# Patient Record
Sex: Female | Born: 1980
Health system: Southern US, Community
[De-identification: ages and names within clinical notes are randomized; demographics above are authoritative.]

## PROBLEM LIST (undated history)

## (undated) DIAGNOSIS — J45909 Unspecified asthma, uncomplicated: Secondary | ICD-10-CM

## (undated) DIAGNOSIS — F329 Major depressive disorder, single episode, unspecified: Secondary | ICD-10-CM

## (undated) DIAGNOSIS — F32A Depression, unspecified: Secondary | ICD-10-CM

## (undated) DIAGNOSIS — I10 Essential (primary) hypertension: Secondary | ICD-10-CM

---
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
## 2001-05-09 ENCOUNTER — Encounter (INDEPENDENT_AMBULATORY_CARE_PROVIDER_SITE_OTHER): Payer: Self-pay | Admitting: *Deleted

## 2001-05-09 ENCOUNTER — Encounter: Admission: RE | Admit: 2001-05-09 | Discharge: 2001-05-09 | Payer: Self-pay | Admitting: Family Medicine

## 2001-05-09 LAB — CONVERTED CEMR LAB

## 2001-05-23 ENCOUNTER — Encounter: Admission: RE | Admit: 2001-05-23 | Discharge: 2001-05-23 | Payer: Self-pay | Admitting: Family Medicine

## 2001-07-23 ENCOUNTER — Encounter: Admission: RE | Admit: 2001-07-23 | Discharge: 2001-07-23 | Payer: Self-pay | Admitting: Family Medicine

## 2001-09-01 ENCOUNTER — Encounter: Payer: Self-pay | Admitting: Emergency Medicine

## 2001-09-01 ENCOUNTER — Emergency Department (HOSPITAL_COMMUNITY): Admission: EM | Admit: 2001-09-01 | Discharge: 2001-09-01 | Payer: Self-pay | Admitting: Emergency Medicine

## 2001-09-02 ENCOUNTER — Encounter: Admission: RE | Admit: 2001-09-02 | Discharge: 2001-09-02 | Payer: Self-pay | Admitting: Family Medicine

## 2001-09-19 ENCOUNTER — Inpatient Hospital Stay (HOSPITAL_COMMUNITY): Admission: EM | Admit: 2001-09-19 | Discharge: 2001-09-22 | Payer: Self-pay | Admitting: Psychiatry

## 2001-09-19 ENCOUNTER — Encounter: Admission: RE | Admit: 2001-09-19 | Discharge: 2001-09-19 | Payer: Self-pay | Admitting: Family Medicine

## 2003-05-09 ENCOUNTER — Emergency Department (HOSPITAL_COMMUNITY): Admission: EM | Admit: 2003-05-09 | Discharge: 2003-05-10 | Payer: Self-pay | Admitting: Emergency Medicine

## 2003-09-27 ENCOUNTER — Emergency Department (HOSPITAL_COMMUNITY): Admission: EM | Admit: 2003-09-27 | Discharge: 2003-09-27 | Payer: Self-pay | Admitting: Emergency Medicine

## 2005-06-14 ENCOUNTER — Ambulatory Visit: Payer: Self-pay | Admitting: Family Medicine

## 2005-06-20 ENCOUNTER — Encounter: Admission: RE | Admit: 2005-06-20 | Discharge: 2005-06-20 | Payer: Self-pay | Admitting: Sports Medicine

## 2005-09-25 ENCOUNTER — Ambulatory Visit: Payer: Self-pay | Admitting: Sports Medicine

## 2005-12-26 ENCOUNTER — Ambulatory Visit: Payer: Self-pay | Admitting: Sports Medicine

## 2005-12-29 ENCOUNTER — Ambulatory Visit: Payer: Self-pay | Admitting: Family Medicine

## 2006-03-08 DIAGNOSIS — F329 Major depressive disorder, single episode, unspecified: Secondary | ICD-10-CM

## 2006-03-08 DIAGNOSIS — F3289 Other specified depressive episodes: Secondary | ICD-10-CM | POA: Insufficient documentation

## 2006-03-08 DIAGNOSIS — J309 Allergic rhinitis, unspecified: Secondary | ICD-10-CM | POA: Insufficient documentation

## 2006-03-08 DIAGNOSIS — E669 Obesity, unspecified: Secondary | ICD-10-CM | POA: Insufficient documentation

## 2006-03-08 DIAGNOSIS — J45909 Unspecified asthma, uncomplicated: Secondary | ICD-10-CM | POA: Insufficient documentation

## 2006-03-08 DIAGNOSIS — N912 Amenorrhea, unspecified: Secondary | ICD-10-CM

## 2006-03-08 DIAGNOSIS — F17200 Nicotine dependence, unspecified, uncomplicated: Secondary | ICD-10-CM

## 2006-03-09 ENCOUNTER — Encounter (INDEPENDENT_AMBULATORY_CARE_PROVIDER_SITE_OTHER): Payer: Self-pay | Admitting: *Deleted

## 2006-11-16 ENCOUNTER — Telehealth: Payer: Self-pay | Admitting: *Deleted

## 2006-11-17 ENCOUNTER — Emergency Department (HOSPITAL_COMMUNITY): Admission: EM | Admit: 2006-11-17 | Discharge: 2006-11-17 | Payer: Self-pay | Admitting: Emergency Medicine

## 2006-11-19 ENCOUNTER — Telehealth (INDEPENDENT_AMBULATORY_CARE_PROVIDER_SITE_OTHER): Payer: Self-pay | Admitting: *Deleted

## 2006-11-19 ENCOUNTER — Encounter (INDEPENDENT_AMBULATORY_CARE_PROVIDER_SITE_OTHER): Payer: Self-pay | Admitting: *Deleted

## 2006-11-19 ENCOUNTER — Ambulatory Visit: Payer: Self-pay | Admitting: Family Medicine

## 2006-11-19 DIAGNOSIS — A5901 Trichomonal vulvovaginitis: Secondary | ICD-10-CM

## 2006-11-20 ENCOUNTER — Encounter (INDEPENDENT_AMBULATORY_CARE_PROVIDER_SITE_OTHER): Payer: Self-pay | Admitting: Family Medicine

## 2007-09-20 ENCOUNTER — Emergency Department (HOSPITAL_COMMUNITY): Admission: EM | Admit: 2007-09-20 | Discharge: 2007-09-20 | Payer: Self-pay | Admitting: Family Medicine

## 2007-11-18 ENCOUNTER — Telehealth: Payer: Self-pay | Admitting: *Deleted

## 2007-11-21 ENCOUNTER — Ambulatory Visit: Payer: Self-pay | Admitting: Family Medicine

## 2007-11-21 LAB — CONVERTED CEMR LAB: Beta hcg, urine, semiquantitative: NEGATIVE

## 2008-04-17 ENCOUNTER — Encounter: Admission: RE | Admit: 2008-04-17 | Discharge: 2008-04-17 | Payer: Self-pay | Admitting: Family Medicine

## 2008-04-17 ENCOUNTER — Ambulatory Visit: Payer: Self-pay | Admitting: Family Medicine

## 2008-04-17 DIAGNOSIS — M25569 Pain in unspecified knee: Secondary | ICD-10-CM

## 2008-06-20 ENCOUNTER — Emergency Department (HOSPITAL_COMMUNITY): Admission: EM | Admit: 2008-06-20 | Discharge: 2008-06-20 | Payer: Self-pay | Admitting: Emergency Medicine

## 2008-06-22 ENCOUNTER — Telehealth: Payer: Self-pay | Admitting: Family Medicine

## 2008-06-23 ENCOUNTER — Ambulatory Visit: Payer: Self-pay | Admitting: Family Medicine

## 2008-06-23 DIAGNOSIS — I10 Essential (primary) hypertension: Secondary | ICD-10-CM

## 2008-06-26 ENCOUNTER — Telehealth: Payer: Self-pay | Admitting: *Deleted

## 2008-07-07 ENCOUNTER — Telehealth: Payer: Self-pay | Admitting: Family Medicine

## 2008-07-08 ENCOUNTER — Ambulatory Visit: Payer: Self-pay | Admitting: Family Medicine

## 2008-07-08 DIAGNOSIS — N63 Unspecified lump in unspecified breast: Secondary | ICD-10-CM

## 2008-07-09 ENCOUNTER — Telehealth: Payer: Self-pay | Admitting: Family Medicine

## 2008-07-20 ENCOUNTER — Telehealth: Payer: Self-pay | Admitting: Sports Medicine

## 2008-08-11 ENCOUNTER — Telehealth: Payer: Self-pay | Admitting: Family Medicine

## 2008-08-11 ENCOUNTER — Ambulatory Visit: Payer: Self-pay | Admitting: Family Medicine

## 2008-08-11 DIAGNOSIS — L723 Sebaceous cyst: Secondary | ICD-10-CM

## 2008-11-08 ENCOUNTER — Observation Stay (HOSPITAL_COMMUNITY): Admission: EM | Admit: 2008-11-08 | Discharge: 2008-11-08 | Payer: Self-pay | Admitting: Emergency Medicine

## 2009-01-12 ENCOUNTER — Telehealth: Payer: Self-pay | Admitting: Family Medicine

## 2010-01-17 ENCOUNTER — Encounter: Payer: Self-pay | Admitting: Family Medicine

## 2010-02-08 NOTE — Assessment & Plan Note (Signed)
 Summary: lump between breasts x 2 yr. bigger lately/[place]   Vital Signs:  Patient profile:   30 year old female Height:      63.5 inches Weight:      199 pounds BMI:     34.82 BSA:     1.94 Temp:     98.6 degrees F Pulse rate:   73 / minute BP sitting:   136 / 97  Vitals Entered By: Harlene Carte CMA (July 08, 2008 8:27 AM) CC: lump between breast x 2 years, recently bigger Pain Assessment Patient in pain? yes     Location: lump Intensity: 10 when touched   Primary Care Provider:  JEOFFREY ANGER MD  CC:  lump between breast x 2 years and recently bigger.  History of Present Illness: 89F with HTN, obesity, asthma comes in as work in with c/o painful breast mass.  Breast mass:  Has been present 3+ years, pt had U/S in 2007 that showed sebacious cyst.  Recently over the past 1-2 wks became more painful and enlarged.  Has had similar lesion years ago that popped and went away.  No fevers/chills, weight loss, no drainage, no other breast masses currently, no swollen glands or lesions noticed by pt.  Pt would like it removed.  Habits & Providers  Alcohol-Tobacco-Diet     Tobacco Status: current     Tobacco Counseling: to quit use of tobacco products     Cigarette Packs/Day: 0.5  Past History:  Past Medical History: Last updated: 03/08/2006 ASD at birth, now closed, Prolactin WNL; TSH 1.5  05/2001, Secondary amenorrhea, Suicide attempt 09/19/01 tranferred [facility]  Family History: Last updated: 06/23/2008 grandparents-diabetes, htn, CAD, deceased 60`s MI, uncle-CAD, deceased 52`s MI  Father: HTN, DM, deceased age 47 MI  Mom : h/o hirsutism, HTN  Social History: Last updated: 11/21/2007 She has an aunt who is her adopted mom and her step father.  She has brothers and sisters.  Network engineer for [employer]. Lives with fiance in their own apartment. Fiance has 3 kids. He is supportive.   Review of Systems       See HPI  Physical  Exam  General:  Well-developed,well-nourished,in no acute distress; alert,appropriate and cooperative throughout examination Chest Wall:  1.5 cm x 1cm firm, mobile, well circumscribed mass palpable just below skin level at medial border of left breast in inframammary fold.  No overlying erythema, mildly tender to palpation, no drainage. Breasts:  No mass, nodules, thickening, tenderness, bulging, retraction, inflamation, nipple discharge or skin changes noted.   Additional Exam:  Procedure: Excision of breast mass.  Pt informed and consented to procedure.  Explained risks, benefits, and alternatives.  Explained possibility of infection, scarring, incomplete excision.  Pt understands and agrees to procedure.    Time out conducted.  Field block performed with 1% lidocaine with epi  ~8cc.  Adequate local anesthesia achieved. Area prepped with betadyne, draped in a sterile fashion, 2.5cm incision made with #15 blade into lesion, darkish round lesions removed from area, scissors used to bluntly dissect around lesion.  Several of the round dark lesions removed as well as some surrounding fatty tissue.  Most of lesion removed.  4-0 Vicryl used and wound closed with running subcuticular suture.  Hemostasis achieved.  Covered with triple antibiotic ointment, gauze, and tegaderm.  Pt in stable condition.   Impression & Recommendations:  Problem # 1:  BREAST MASS (ICD-611.72) Assessment Deteriorated Sebacious cyst vs fat necrosis of breast tissue  vs other lesion.  Well circumscribed, doubt malignant.  Excisional biopsy performed today.  Specimen sent for pathologic diagnosis.  Pt to RTC in one week for me to check wound.  Orders: Provider Misc Charge- [facility] (Misc) [facility]- Est Level  3 [phone])  Complete Medication List: 1)  Diclofenac Sodium 50 Mg Tbec (Diclofenac sodium) .SABRA.. 1 tab q8 hours x 2 weeks, then as needed pain 2)  Hydrochlorothiazide  12.5 Mg Caps (Hydrochlorothiazide ) .SABRA.. 1 by mouth daily for  blood pressure 3)  Proventil  Hfa 108 (90 Base) Mcg/act Aers (Albuterol  sulfate) .... 2 puffs inhaled every 4 hours as needed for wheezing 4)  Prednisone 20 Mg Tabs (Prednisone) .... 2 by mouth daily x 10 days 5)  Percocet 5-325 Mg Tabs (Oxycodone-acetaminophen ) .... One tab by mouth q4h as needed for pain.  Patient Instructions: 1)  Great to meet you today, 2)  We excised the lesion in your breast today.  I want you to come back to see me in one week for a wound check, ok to double book appointments.   3)  Don't shower for 48h, after that it will be ok.  Keep it dry and clean. 4)  If you start to have fevers/chills, or the area starts to become reddish or drains whitish/yellowish pus then come back earlier to the office.  5)  We are sending the tissue samples off for analysis. 6)  I will also give you 15 percocet tabs. 7)  -Dr. ONEIDA. Prescriptions: PERCOCET 5-325 MG TABS (OXYCODONE-ACETAMINOPHEN ) One tab by mouth q4h as needed for pain.  #15 x 0   Entered and Authorized by:   Debby Petties MD   Signed by:   Debby Petties MD on 07/08/2008   Method used:   Print then Give to Patient   RxID:   [id number]

## 2010-02-08 NOTE — Progress Notes (Signed)
Summary: triage   Phone Note Call from Patient Call back at Home Phone [phone]   Caller: Patient Summary of Call: needs to talk to nurse about discharge Initial call taken by: De Nurse,  January 12, 2009 12:21 PM  Follow-up for Phone Call        discharge started today. it is greenish. denies itch or pain. wants to be seen next week as she is not in town now. appt made for 8:30am work in monday at her request Follow-up by: Golden Circle RN,  January 12, 2009 12:22 PM  Additional Follow-up for Phone Call Additional follow up Details #1::        I will not be her at that time but I appreciate you getting her an appointment.  Additional Follow-up by: Jamie Brookes MD,  January 12, 2009 3:18 PM

## 2010-02-08 NOTE — Progress Notes (Signed)
   Phone Note Outgoing Call Call back at [facility] Phone [phone]   Call placed by: Debby Petties MD,  July 20, 2008 5:01 PM Summary of Call: Called pt to see how she was doing.  She states that the left sided pain and lump in her throat she had in the previous phone note have completely resolved and she is feeling great.  Wound is clean, healing very well, no fluctuance, swelling, drainage, or pain.  Just has occasional mild itch over incision site.  She will call office if any problems arise.   Initial call taken by: Debby Petties MD,  July 20, 2008 5:01 PM

## 2010-02-10 NOTE — Miscellaneous (Signed)
Summary: asthma QI   Clinical Lists Changes  Problems: Changed problem from ASTHMA UNSPECIFIED WITH EXACERBATION (ICD-493.92) to ASTHMA, INTERMITTENT (ICD-493.90)

## 2010-04-14 LAB — POCT I-STAT, CHEM 8
BUN: 6 mg/dL (ref 6–23)
Calcium, Ion: 1.1 mmol/L — ABNORMAL LOW (ref 1.12–1.32)
Chloride: 104 mEq/L (ref 96–112)
Glucose, Bld: 129 mg/dL — ABNORMAL HIGH (ref 70–99)
HCT: 48 % — ABNORMAL HIGH (ref 36.0–46.0)
Hemoglobin: 16.3 g/dL — ABNORMAL HIGH (ref 12.0–15.0)
TCO2: 21 mmol/L (ref 0–100)

## 2010-05-27 NOTE — Discharge Summary (Signed)
NAME:  Katherine Knight, Katherine Knight                        ACCOUNT NO.:  [id number]   MEDICAL RECORD NO.:  [id number]                  PATIENT TYPE:  IPS   LOCATION:  0503                                 FACILITY:  [facility]   PHYSICIAN:  Geoffery Lyons, M.D.                   DATE OF BIRTH:  28-Nov-1980   DATE OF ADMISSION:  09/19/2001  DATE OF DISCHARGE:  09/22/2001                                 DISCHARGE SUMMARY   CHIEF COMPLAINT AND PRESENT ILLNESS:  This was the first admission to [facility] for this 30 year old single black female  voluntarily admitted.  She presented with a suicidal attempt gesture on  Wednesday, September 10, after having an argument with her father.  She was  feeling very frustrated, not being able to go back to school due to  financial reasons, unable to get a loan, had not been able to find a job  since June 2003.  She was unable to go back to school because of financial  reasons.  Her school performance was not up to par, grades were below  average, was unable to get financial aid.  She wrote a suicide note to her  parents and her pastor that she was giving up.  She had a plan to cut her  wrists, was found by her mother before she had attempted to harm herself.  She claimed she made a mistake, she was fine, she wanted to go home.   PAST PSYCHIATRIC HISTORY:  Noncontributory.   SUBSTANCE ABUSE HISTORY:  She denied the use or abuse of any substances.   PAST MEDICAL HISTORY:  Medical history: Some elevated blood pressure  readings, which she stated was secondary to anxiety.   MEDICATIONS:  She was not taking any active medications other than birth  control pills.   PHYSICAL EXAMINATION:  Physical examination was performed, failed to show  any acute findings.   MENTAL STATUS EXAM:  Mental status exam revealed an alert, pleasant,  cooperative, thin, neatly dressed female, good eye contact.  Speech was  clear and relevant, interacting and  conversational.  Mood: She claimed to  feel better.  Affect was pleasant.  Thought processes were logical,  coherent, goal directed.  Cognitive: Cognition was well preserved.   ADMISSION DIAGNOSES:   AXIS I:  Depressive disorder, not otherwise specified.   AXIS II:  No diagnosis.   AXIS III:  Elevated blood pressure readings.   AXIS IV:  Moderate.   AXIS V:  Global assessment of functioning upon admission 35, highest global  assessment of functioning in the last year 70-75.   LABORATORY DATA:  Other laboratory workup: CBC was within normal limits.  Blood chemistries were within normal limits.  Thyroid profile was within  normal limits.  Drug screen was negative for substances of abuse.   HOSPITAL COURSE:  She was admitted and started in intensive individual and  group psychotherapy.  She was able to identify the stressors, not having  job, not being able to go back to school, had been depressed, irritable,  less tolerant to frustration.  She started working on Pharmacologist, stress  management.  We had a family session with her parents.  She was going to  return home, find a job, and back to school next semester, and followup on  an outpatient basis.  On September 14, it was assessed that she was in full  contact with reality, mood improved, affect brighter.  She had tolerated the  Lexapro well with no side effects, willing and motivated to pursue further  treatment.  She would go home, would go to church, talk to her pastor, will  work with her family to find a job.  As there were no suicidal or homicidal  ideas, we discharged to outpatient treatment.   DISCHARGE DIAGNOSES:   AXIS I:  Depressive disorder, not otherwise specified.   AXIS II:  No diagnosis.   AXIS III:  No diagnosis.   AXIS IV:  Moderate   AXIS V:  Global assessment of functioning upon discharge 60.   DISCHARGE MEDICATIONS:  1. Lexapro 10 mg daily.  2. Ambien as needed for sleep.   FOLLOW UP:   [facility]                                                 Geoffery Lyons, M.D.    IL/MEDQ  D:  10/23/2001  T:  10/23/2001  Job:  [id number]

## 2010-05-27 NOTE — H&P (Signed)
NAME:  Katherine Knight, Katherine Knight                        ACCOUNT NO.:  [id number]   MEDICAL RECORD NO.:  [id number]                   PATIENT TYPE:  IPS   LOCATION:  0503                                 [facility]ILITY[facility]   PHYSICIAN:  Geoffery Lyons, MD                     DATE OF BIRTH:  1980-09-20   DATE OF ADMISSION:  09/19/2001  DATE OF DISCHARGE:                         PSYCHIATRIC ADMISSION ASSESSMENT   IDENTIFYING INFORMATION:  This is a 30 year old single black female who is  voluntarily admitted on September 19, 2001.   HISTORY OF PRESENT ILLNESS:  The patient presented with a suicide  attempt/gesture on Wednesday, September 18, 2001 after having an argument  with her father.  The patient was feeling very frustrated with not being  able to go back to school due to financial reasons, unable to get a loan,  has not been able to find a job since June of 2003.  The patient states she  was unable to go back to school at [employer] because of financial reasons.  Her  school performance was not up to par.  Grades are below average and was  unable to get financial aide.  The patient states she wrote a suicide note  to her parents and her pastor that she was giving up.  She was having a plan  to cut her wrist.  She states she was found by her mother before she had  attempted to harm herself.  Today, she states she feels fine, that she knew  she made a mistake and she states she would never harm herself again.  She  has good family support from her family, pastor and from her boyfriend.  Her  sleep and appetite has been good.   PAST PSYCHIATRIC HISTORY:  None.   SOCIAL HISTORY:  She is sexually active, has one partner for last six  months.  She practices safe sex.  Currently looking for employment.   FAMILY HISTORY:  The patient is adopted.  Currently, she states her adoptive  mother has been hospitalized twice for depression.   ALCOHOL/DRUG HISTORY:  Denies any alcohol or drug use.   PRIMARY CARE  PHYSICIAN:  Dr. Pricilla Holm, at [facility].   MEDICAL PROBLEMS:  None.  The patient has had some elevated blood pressure  readings, she states, from anxiety.   MEDICATIONS:  Birth control pills.  Currently does not have her pills with  her and states she needs to get a refill.   ALLERGIES:  No known allergies.   PHYSICAL EXAMINATION:  VITAL SIGNS:  The patient is 5 feet 3 inches tall.  She is 204 pounds.  Her vital signs are temperature 98.4, heart rate 75,  respirations 16, blood pressure 157/96.  GENERAL:  The patient is overweight, 30 year old single African-American  female in no acute distress.  She is obese in stature and appears her stated  age.  She is well-groomed, alert and cooperative, very pleasant.  HEENT:  Head is normocephalic.  She can raise her eyebrows.  Her hair is  chin-length and evenly distributed.  Her EOMs are intact.  Her external ear  canals are patent.  Cerumen is in both ear canals.  Hearing is appropriate  to conversation.  No sinus tenderness.  No nasal discharge.  Her mucosa is  moist.  She has good dentition.  Tongue protrudes midline without tremor.  NECK:  Supple.  No JVD.  Negative lymphadenopathy.  Trachea is midline.  Thyroid is nonpalpable, nontender.  CHEST:  Clear to auscultation.  Thorax is symmetrical with good expansion.  No cough.  HEART:  Regular rate and rhythm.  Carotid pulses are equal and adequate.  No  edema was noted.  BREAST:  Exam is deferred.  ABDOMEN:  Obese, soft, nontender abdomen.  No CVA tenderness.  MUSCULOSKELETAL:  No joint swelling or deformity.  Good range of motion.  Muscle strength and tone is equal bilaterally with no signs of injury.  SKIN:  Warm and dry.  Nail beds are pink with good capillary refill.  No  rashes or lacerations were noted.  NEUROLOGIC:  Oriented x 3.  Cranial nerves are grossly intact.  Deep tendon  reflexes are equal.  Good grip strength bilaterally.  No involuntary   movements.  Gait is normal.  Cerebellar function intact with finger to  finger and normal alternating movements.  Romberg is negative.   Health maintenance issues were addressed with regards to nutrition, exercise  and follow-up on blood pressure.   LABORATORY DATA:  CBC within normal limits.  Urine pregnancy test was  negative.  Urinalysis was within normal limits.   MENTAL STATUS EXAM:  She is an alert, pleasant, cooperative, clean, neatly  dressed female with good eye contact.  Speech is clear and relevant,  interactive and conversational.  Mood states she feels good and better.  Her affect is pleasant and tries to be bright.  Thought processes are  logical, coherent and goal directed.  Cognitively, she is oriented x 3.  Good memory.  Judgment is fair.  Insight is fair.   DIAGNOSES:   AXIS I:  Depressive disorder not otherwise specified.   AXIS II:  Deferred.   AXIS III:  Elevated blood pressure readings.   AXIS IV:  Moderate (psychosocial stressors, primary support group,  education, occupation, economic problems).   AXIS V:  Current 30-35; past year 27-75.   PLAN:  Voluntary admission to [facility] for depression and  suicidal gesture.  Contract for safety.  Initiate Lexapro to decrease  depressive symptoms and stabilize mood and thinking so patient can be safe.  Have family session prior to discharge.  The patient to attend groups to  improve her coping skills.  To follow up with mental health.  Health  maintenance was discussed of patient's nutrition, exercise and blood  pressure control.   TENTATIVE LENGTH OF STAY:  Three to four days.     Landry Corporal, N.P.                       Geoffery Lyons, MD    JO/MEDQ  D:  09/20/2001  T:  09/20/2001  Job:  [id number]

## 2010-10-18 LAB — DIFFERENTIAL
Eosinophils Absolute: 0
Eosinophils Relative: 1
Lymphs Abs: 0.7
Monocytes Relative: 4
Neutro Abs: 4
Neutrophils Relative %: 81 — ABNORMAL HIGH

## 2010-10-18 LAB — BASIC METABOLIC PANEL
CO2: 24
Creatinine, Ser: 0.88
GFR calc Af Amer: >60
Glucose, Bld: 80

## 2010-10-18 LAB — CBC
Hemoglobin: 14.3
MCV: 93.4
Platelets: 277

## 2011-02-09 ENCOUNTER — Other Ambulatory Visit: Payer: Self-pay | Admitting: Family Medicine

## 2011-06-23 ENCOUNTER — Encounter: Payer: Self-pay | Admitting: Family Medicine

## 2011-06-23 ENCOUNTER — Ambulatory Visit (INDEPENDENT_AMBULATORY_CARE_PROVIDER_SITE_OTHER): Payer: Self-pay | Admitting: Family Medicine

## 2011-06-23 VITALS — BP 152/102 | HR 79 | Temp 98.4°F | Ht 63.0 in | Wt 193.8 lb

## 2011-06-23 DIAGNOSIS — N912 Amenorrhea, unspecified: Secondary | ICD-10-CM

## 2011-06-23 DIAGNOSIS — Z202 Contact with and (suspected) exposure to infections with a predominantly sexual mode of transmission: Secondary | ICD-10-CM

## 2011-06-23 DIAGNOSIS — R21 Rash and other nonspecific skin eruption: Secondary | ICD-10-CM

## 2011-06-23 DIAGNOSIS — Z2089 Contact with and (suspected) exposure to other communicable diseases: Secondary | ICD-10-CM

## 2011-06-23 LAB — HIV ANTIBODY (ROUTINE TESTING W REFLEX): HIV: NONREACTIVE

## 2011-06-23 MED ORDER — TRIAMCINOLONE ACETONIDE 0.1 % EX CREA: TOPICAL_CREAM | Freq: Two times a day (BID) | CUTANEOUS | Status: AC

## 2011-06-23 NOTE — Progress Notes (Signed)
Patient ID: Katherine Knight, female   DOB: Nov 27, 1980, 31 y.o.   MRN: [id number] Katherine Knight is a 31 y.o. female who presents to [facility] today for rash:  1.  Rash:  Erupted about 1 month ago.  Had several of red, scaly patches that start on arms and legs and then spread to trunk.  Describes itching and burning with them.  No previous illnesses prior to these episodes.    History of this around spring-time last year, told it was pityriasis rosea at that time.  Lasted until December.  Had some spots palm of her hand last time, none now.  Not given any medications.     The following portions of the patient's history were reviewed and updated as appropriate: allergies, current medications, past medical history, family and social history, and problem list.  Patient is a nonsmoker.   ROS as above otherwise neg. No Chest pain, palpitations, SOB, Fever, Chills, Abd pain, N/V/D.  Medications reviewed. Current Outpatient Prescriptions  Medication Sig Dispense Refill  . albuterol (PROVENTIL HFA) 108 (90 BASE) MCG/ACT inhaler Inhale 2 puffs into the lungs every 4 (four) hours as needed.        . diclofenac (VOLTAREN) 50 MG EC tablet Take 50 mg by mouth every 8 (eight) hours. Take for 2 weeks, then as needed for pain       . hydrochlorothiazide (HYDRODIURIL) 12.5 MG tablet Take 12.5 mg by mouth daily.        . predniSONE (DELTASONE) 20 MG tablet Take 40 mg by mouth daily. Take 2 by mouth daily for 10 days       . sulfamethoxazole-trimethoprim (BACTRIM DS,SEPTRA DS) 800-160 MG per tablet Take 1 tablet by mouth 2 (two) times daily.          Exam:  BP 152/102  Pulse 79  Temp 98.4 F (36.9 C) (Oral)  Ht 5\' 3"  (1.6 m)  Wt 193 lb 12.8 oz (87.907 kg)  BMI 34.33 kg/m2  LMP 06/12/2011 Gen: Well NAD HEENT: EOMI,  MMM Lungs: CTABL Nl WOB Heart: RRR no MRG Abd: NABS, NT, ND Exts: Non edematous BL  LE, warm and well perfused.   No results found for this or any previous visit (from the past 72  hour(s)).

## 2011-06-23 NOTE — Patient Instructions (Addendum)
I'm not sure at this time what the spots are.  I am sending in a steroid cream for you to use for the next 2 weeks.   At that time, if they have not resolved, please come back and we may need to do a biopsy at that time.  The only other thing for today will be a blood test.  I will let you know the results.

## 2011-06-26 DIAGNOSIS — R21 Rash and other nonspecific skin eruption: Secondary | ICD-10-CM | POA: Insufficient documentation

## 2011-06-26 NOTE — Assessment & Plan Note (Signed)
Pityriasis less likely based on history and exam. Also examined with Dr. Mauricio Po.   Unclear etiology History of palmar rash (scarring now, no rash on palms or soles currently) -- therefore obtain RPR today. Triamcinolone for relief.   FU in 2 weeks if no resolution.   May need punch biopsy at that time.

## 2011-10-17 IMAGING — CR DG CHEST 2V
2 series · 2 of 2 positions shown · non-contrast
Comparison: 06/20/2008

CLINICAL DATA: Short of breath

CHEST - 2 VIEW

[w chest pa]
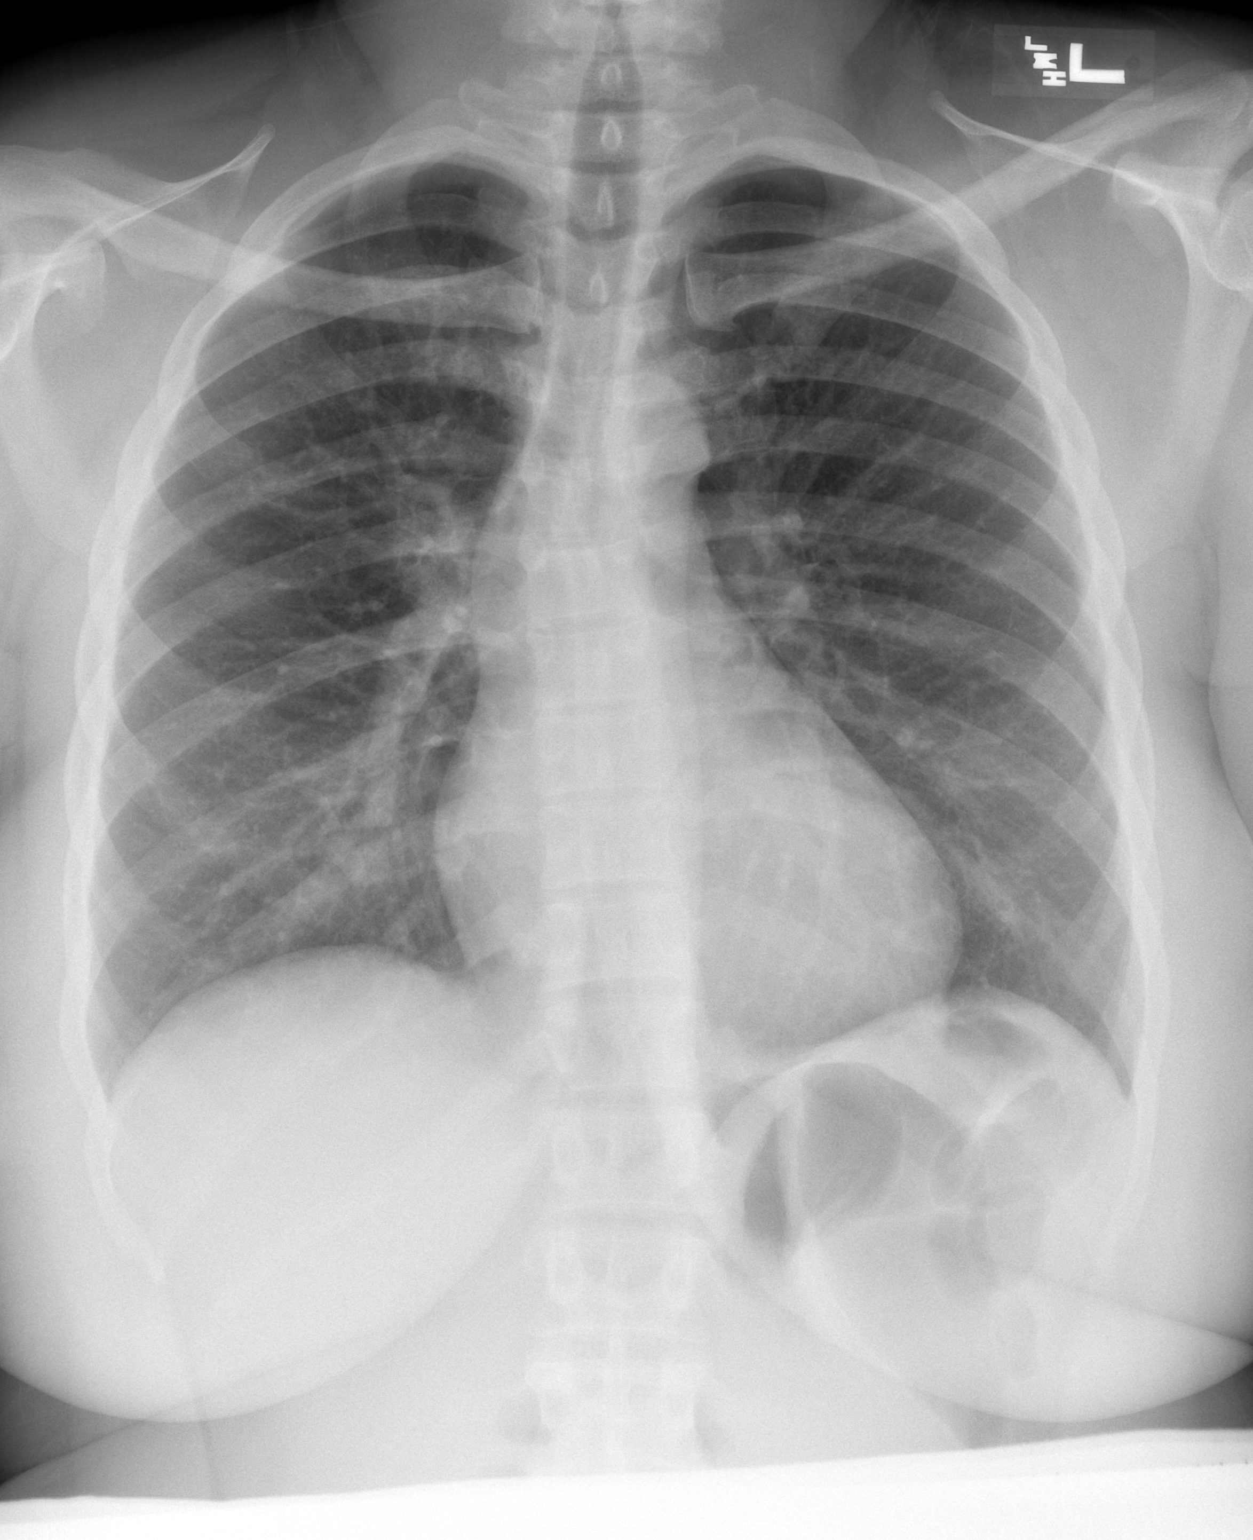

[w chest lat]
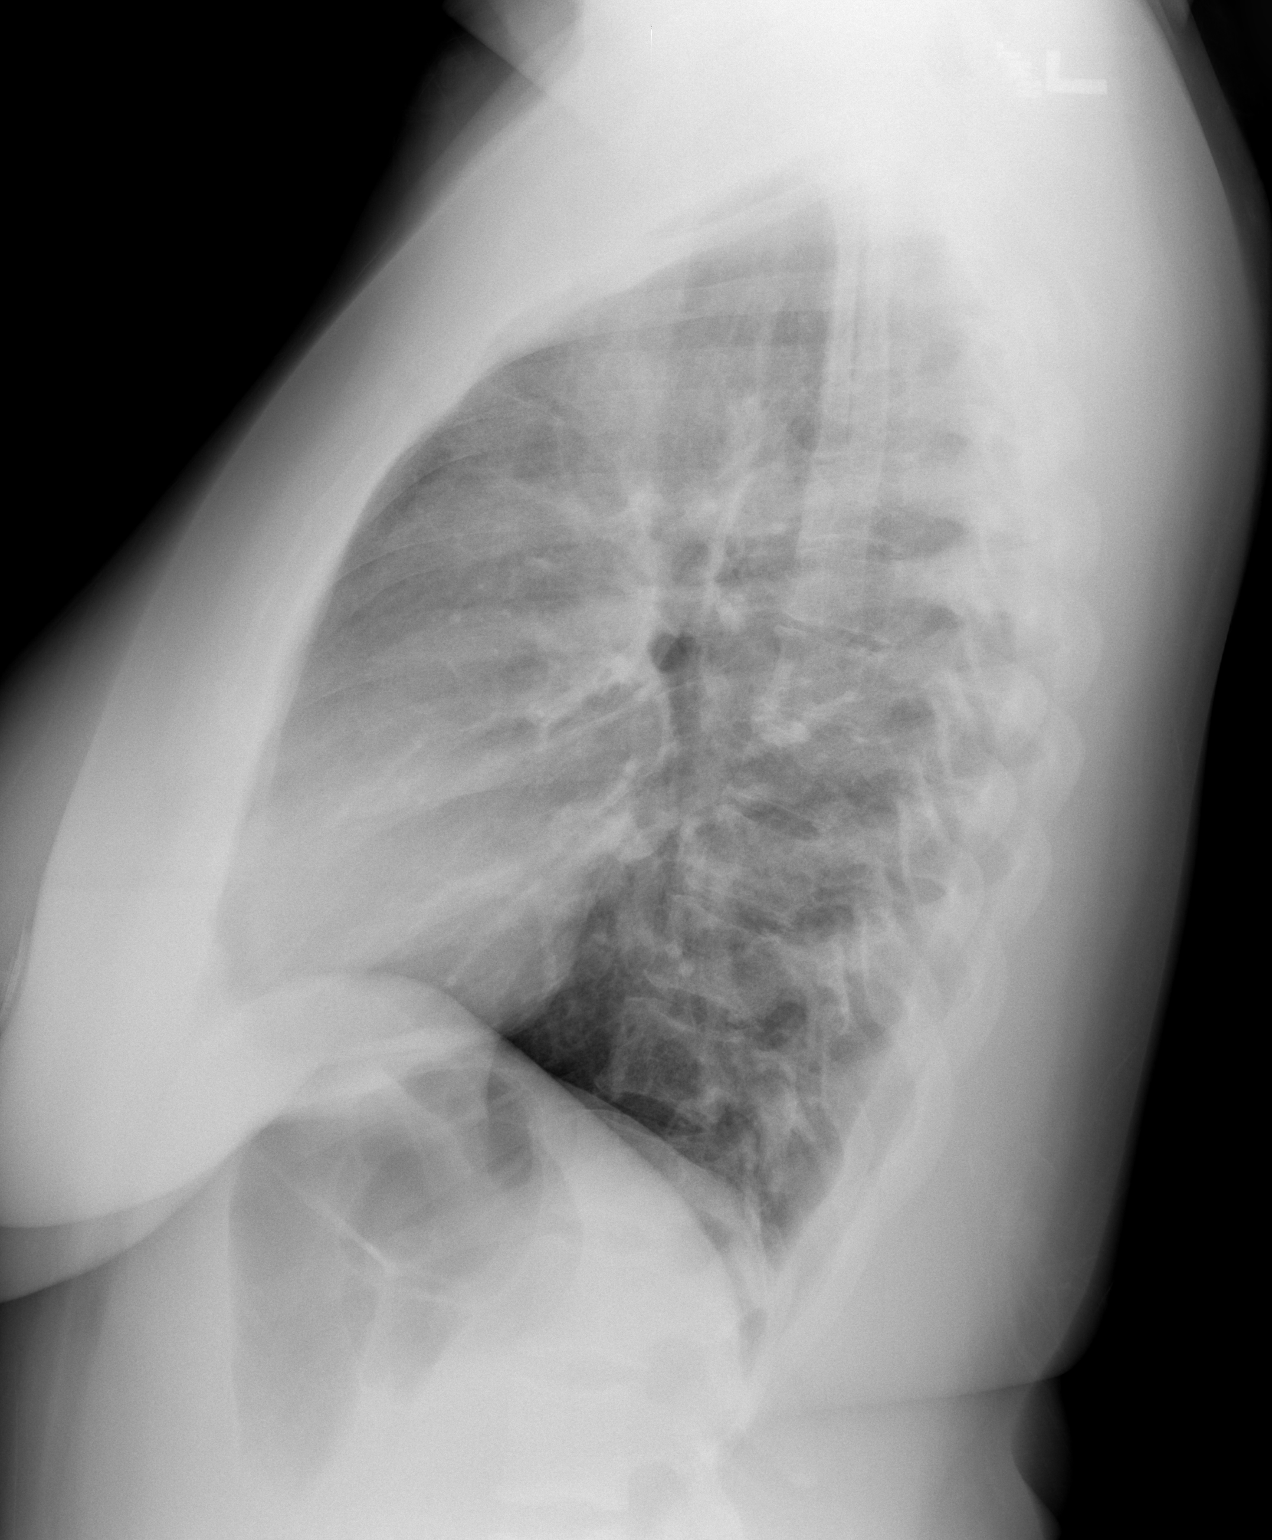

[2 of 2 positions shown; findings below may reference images not displayed]

FINDINGS: Normal heart size.  Clear lungs.  No pneumothorax.  No
pleural effusion.
IMPRESSION: No active cardiopulmonary disease.

## 2012-09-07 ENCOUNTER — Emergency Department (HOSPITAL_COMMUNITY)
Admission: EM | Admit: 2012-09-07 | Discharge: 2012-09-07 | Disposition: A | Discharge status: Home / Self Care | Payer: Self-pay | Attending: Emergency Medicine | Admitting: Emergency Medicine

## 2012-09-07 ENCOUNTER — Encounter (HOSPITAL_COMMUNITY): Payer: Self-pay | Admitting: *Deleted

## 2012-09-07 DIAGNOSIS — J45909 Unspecified asthma, uncomplicated: Secondary | ICD-10-CM | POA: Insufficient documentation

## 2012-09-07 DIAGNOSIS — R112 Nausea with vomiting, unspecified: Secondary | ICD-10-CM

## 2012-09-07 DIAGNOSIS — R197 Diarrhea, unspecified: Secondary | ICD-10-CM | POA: Insufficient documentation

## 2012-09-07 DIAGNOSIS — R5381 Other malaise: Secondary | ICD-10-CM | POA: Insufficient documentation

## 2012-09-07 DIAGNOSIS — F17200 Nicotine dependence, unspecified, uncomplicated: Secondary | ICD-10-CM | POA: Insufficient documentation

## 2012-09-07 DIAGNOSIS — Z79899 Other long term (current) drug therapy: Secondary | ICD-10-CM | POA: Insufficient documentation

## 2012-09-07 HISTORY — DX: Unspecified asthma, uncomplicated: J45.909

## 2012-09-07 MED ORDER — FAMOTIDINE IN NACL 20-0.9 MG/50ML-% IV SOLN
20.0000 mg | Freq: Once | INTRAVENOUS | Status: AC
  Administered 2012-09-07: 20 mg via INTRAVENOUS
  Filled 2012-09-07: qty 50

## 2012-09-07 MED ORDER — ONDANSETRON HCL 4 MG/2ML IJ SOLN
4.0000 mg | Freq: Once | INTRAMUSCULAR | Status: AC
  Administered 2012-09-07: 4 mg via INTRAVENOUS
  Filled 2012-09-07: qty 2

## 2012-09-07 MED ORDER — SODIUM CHLORIDE 0.9 % IV BOLUS (SEPSIS)
1000.0000 mL | Freq: Once | INTRAVENOUS | Status: AC
  Administered 2012-09-07: 1000 mL via INTRAVENOUS

## 2012-09-07 MED ORDER — PROMETHAZINE HCL 25 MG PO TABS: 25.0000 mg | ORAL_TABLET | Freq: Four times a day (QID) | ORAL | Status: DC | PRN

## 2012-09-07 NOTE — ED Notes (Signed)
Pt able to tolerate PO fluids.  

## 2012-09-07 NOTE — ED Provider Notes (Signed)
CSN: [id number]     Arrival date & time 09/07/12  1223 History   First MD Initiated Contact with Patient 09/07/12 1433     Chief Complaint  Patient presents with  . Emesis  . Diarrhea   (Consider location/radiation/quality/duration/timing/severity/associated sxs/prior Treatment) Patient is a 32 y.o. female presenting with vomiting and diarrhea. The history is provided by the patient.  Emesis Severity:  Moderate Duration:  12 hours Timing:  Constant Number of daily episodes:  6 Progression:  Unchanged Chronicity:  New Context: not post-tussive and not self-induced   Relieved by:  Nothing Worsened by:  Nothing tried Associated symptoms: diarrhea   Associated symptoms: no abdominal pain, no chills, no cough and no fever   Diarrhea:    Quality:  Watery   Number of occurrences:  3   Severity:  Moderate   Duration:  12 hours   Timing:  Constant   Progression:  Unchanged Risk factors: suspect food intake   Risk factors: no alcohol use, no diabetes, not pregnant now, no prior abdominal surgery, no sick contacts and no travel to endemic areas   Diarrhea Associated symptoms: vomiting   Associated symptoms: no abdominal pain, no chills, no recent cough and no fever     Past Medical History  Diagnosis Date  . Asthma    History reviewed. No pertinent past surgical history. History reviewed. No pertinent family history. History  Substance Use Topics  . Smoking status: Current Every Day Smoker -- 1.00 packs/day    Types: Cigarettes  . Smokeless tobacco: Not on file  . Alcohol Use: No   OB History   Grav Para Term Preterm Abortions TAB SAB Ect Mult Living                 Review of Systems  Constitutional: Positive for fatigue. Negative for fever and chills.  Respiratory: Negative for shortness of breath.   Gastrointestinal: Positive for nausea, vomiting and diarrhea. Negative for abdominal pain.  Genitourinary: Negative for frequency and flank pain.  Musculoskeletal:  Negative for back pain.  Neurological: Positive for weakness. Negative for dizziness and light-headedness.    Allergies  Review of patient's allergies indicates no known allergies.  Home Medications   Current Outpatient Rx  Name  Route  Sig  Dispense  Refill  . albuterol (PROVENTIL HFA) 108 (90 BASE) MCG/ACT inhaler   Inhalation   Inhale 2 puffs into the lungs every 4 (four) hours as needed.           . promethazine (PHENERGAN) 25 MG tablet   Oral   Take 1 tablet (25 mg total) by mouth every 6 (six) hours as needed for nausea.   20 tablet   0    BP 148/99  Pulse 81  Temp(Src) 98.5 F (36.9 C) (Oral)  Resp 18  Ht 5\' 3"  (1.6 m)  SpO2 98%  LMP 08/07/2012 Physical Exam  Nursing note and vitals reviewed. Constitutional: She appears well-developed and well-nourished. No distress.  HENT:  Head: Normocephalic and atraumatic.  Eyes: EOM are normal. No scleral icterus.  Neck: Normal range of motion. Neck supple.  Cardiovascular: Normal rate, regular rhythm and intact distal pulses.   Pulmonary/Chest: Effort normal and breath sounds normal. She has no wheezes. She has no rales.  Abdominal: Soft. She exhibits no distension. There is no tenderness. There is no rebound and no guarding.  Neurological: She is alert. She exhibits normal muscle tone. Coordination normal.  Skin: Skin is warm and dry. She is not  diaphoretic.  Psychiatric: She has a normal mood and affect.    ED Course  Procedures (including critical care time) Labs Review Labs Reviewed - No data to display Imaging Review No results found.  RA sat is 98% and I interpret to be normal  3:54 PM Pt is feeling improved, abd soft, drinking water without emesis.  No pain.  Pt did miss work today due to symptoms.    MDM   1. Nausea vomiting and diarrhea      Pt with N/V/D sudden onset about 6 hours after eating Chinese take out.  No obv sick contacts, no alcohol use, no recent abx use, denies fevers, chills.  No  abd tenderness.  Will give IVF's, IV antiemetics, monitor and attempt po challenge afterwards.  Not toxic appearing.      Gavin Pound. Marquerite Forsman, MD 09/07/12 1557

## 2012-09-07 NOTE — ED Notes (Signed)
Pt reports she woke up around 0300 this morning with n/v/d. Reports vomiting 6 times, and 3 episodes of diarrhea. Has not taken medicine to relieve symptoms

## 2013-02-08 ENCOUNTER — Encounter (HOSPITAL_COMMUNITY): Payer: Self-pay | Admitting: Emergency Medicine

## 2013-02-08 ENCOUNTER — Emergency Department (HOSPITAL_COMMUNITY)
Admission: EM | Admit: 2013-02-08 | Discharge: 2013-02-08 | Disposition: A | Discharge status: Home / Self Care | Payer: Self-pay | Attending: Emergency Medicine | Admitting: Emergency Medicine

## 2013-02-08 DIAGNOSIS — F411 Generalized anxiety disorder: Secondary | ICD-10-CM | POA: Insufficient documentation

## 2013-02-08 DIAGNOSIS — F418 Other specified anxiety disorders: Secondary | ICD-10-CM

## 2013-02-08 DIAGNOSIS — F17200 Nicotine dependence, unspecified, uncomplicated: Secondary | ICD-10-CM | POA: Insufficient documentation

## 2013-02-08 DIAGNOSIS — J45909 Unspecified asthma, uncomplicated: Secondary | ICD-10-CM | POA: Insufficient documentation

## 2013-02-08 HISTORY — DX: Depression, unspecified: F32.A

## 2013-02-08 HISTORY — DX: Major depressive disorder, single episode, unspecified: F32.9

## 2013-02-08 MED ORDER — HYDROXYZINE HCL 25 MG PO TABS: 25.0000 mg | ORAL_TABLET | Freq: Four times a day (QID) | ORAL | Status: DC
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
## 2013-02-08 NOTE — ED Notes (Signed)
Pt states she has had several panic attacks today and came to the ER for evaluation. States that she is very unhappy with her job. States that is presently looking for another job, but is still quite anxious at this time.

## 2013-02-08 NOTE — ED Notes (Signed)
Pt. reports panic attack this morning , pt. stated mild anxiety at triage , denies suicidal ideation , calm and cooperative .

## 2013-02-08 NOTE — Discharge Instructions (Signed)
Panic Attacks  Panic attacks are sudden, short-lived surges of severe anxiety, fear, or discomfort. They may occur for no reason when you are relaxed, when you are anxious, or when you are sleeping. Panic attacks may occur for a number of reasons:   · Healthy people occasionally have panic attacks in extreme, life-threatening situations, such as war or natural disasters. Normal anxiety is a protective mechanism of the body that helps us react to danger (fight or flight response).  · Panic attacks are often seen with anxiety disorders, such as panic disorder, social anxiety disorder, generalized anxiety disorder, and phobias. Anxiety disorders cause excessive or uncontrollable anxiety. They may interfere with your relationships or other life activities.  · Panic attacks are sometimes seen with other mental illnesses such as depression and posttraumatic stress disorder.  · Certain medical conditions, prescription medicines, and drugs of abuse can cause panic attacks.  SYMPTOMS   Panic attacks start suddenly, peak within 20 minutes, and are accompanied by four or more of the following symptoms:  · Pounding heart or fast heart rate (palpitations).  · Sweating.  · Trembling or shaking.  · Shortness of breath or feeling smothered.  · Feeling choked.  · Chest pain or discomfort.  · Nausea or strange feeling in your stomach.  · Dizziness, lightheadedness, or feeling like you will faint.  · Chills or hot flushes.  · Numbness or tingling in your lips or hands and feet.  · Feeling that things are not real or feeling that you are not yourself.  · Fear of losing control or going crazy.  · Fear of dying.  Some of these symptoms can mimic serious medical conditions. For example, you may think you are having a heart attack. Although panic attacks can be very scary, they are not life threatening.  DIAGNOSIS   Panic attacks are diagnosed through an assessment by your health care provider. Your health care provider will ask questions  about your symptoms, such as where and when they occurred. Your health care provider will also ask about your medical history and use of alcohol and drugs, including prescription medicines. Your health care provider may order blood tests or other studies to rule out a serious medical condition. Your health care provider may refer you to a mental health professional for further evaluation.  TREATMENT   · Most healthy people who have one or two panic attacks in an extreme, life-threatening situation will not require treatment.  · The treatment for panic attacks associated with anxiety disorders or other mental illness typically involves counseling with a mental health professional, medicine, or a combination of both. Your health care provider will help determine what treatment is best for you.  · Panic attacks due to physical illness usually goes away with treatment of the illness. If prescription medicine is causing panic attacks, talk with your health care provider about stopping the medicine, decreasing the dose, or substituting another medicine.  · Panic attacks due to alcohol or drug abuse goes away with abstinence. Some adults need professional help in order to stop drinking or using drugs.  HOME CARE INSTRUCTIONS   · Take all your medicines as prescribed.    · Check with your health care provider before starting new prescription or over-the-counter medicines.  · Keep all follow up appointments with your health care provider.  SEEK MEDICAL CARE IF:  · You are not able to take your medicines as prescribed.  · Your symptoms do not improve or get worse.  SEEK IMMEDIATE   MEDICAL CARE IF:   · You experience panic attack symptoms that are different than your usual symptoms.  · You have serious thoughts about hurting yourself or others.  · You are taking medicine for panic attacks and have a serious side effect.  MAKE SURE YOU:  · Understand these instructions.  · Will watch your condition.  · Will get help right away  if you are not doing well or get worse.  Document Released: 12/26/2004 Document Revised: 10/16/2012 Document Reviewed: 08/09/2012  [employer]® Patient Information ©2014 [employer].

## 2013-02-08 NOTE — ED Provider Notes (Signed)
Medical screening examination/treatment/procedure(s) were performed by non-physician practitioner and as supervising physician I was immediately available for consultation/collaboration.  EKG Interpretation   None         Alphonza Tramell M Torie Priebe, MD 02/08/13 2301 

## 2013-02-23 ENCOUNTER — Encounter (HOSPITAL_COMMUNITY): Payer: Self-pay | Admitting: Emergency Medicine

## 2013-02-23 ENCOUNTER — Emergency Department (HOSPITAL_COMMUNITY): Payer: Medicaid Other

## 2013-02-23 ENCOUNTER — Emergency Department (HOSPITAL_COMMUNITY)
Admission: EM | Admit: 2013-02-23 | Discharge: 2013-02-23 | Disposition: A | Discharge status: Home / Self Care | Payer: Medicaid Other | Attending: Emergency Medicine | Admitting: Emergency Medicine

## 2013-02-23 DIAGNOSIS — Z87891 Personal history of nicotine dependence: Secondary | ICD-10-CM | POA: Insufficient documentation

## 2013-02-23 DIAGNOSIS — Z3202 Encounter for pregnancy test, result negative: Secondary | ICD-10-CM | POA: Insufficient documentation

## 2013-02-23 DIAGNOSIS — Z79899 Other long term (current) drug therapy: Secondary | ICD-10-CM | POA: Insufficient documentation

## 2013-02-23 DIAGNOSIS — R6889 Other general symptoms and signs: Secondary | ICD-10-CM

## 2013-02-23 DIAGNOSIS — R61 Generalized hyperhidrosis: Secondary | ICD-10-CM | POA: Insufficient documentation

## 2013-02-23 DIAGNOSIS — I10 Essential (primary) hypertension: Secondary | ICD-10-CM

## 2013-02-23 DIAGNOSIS — J111 Influenza due to unidentified influenza virus with other respiratory manifestations: Secondary | ICD-10-CM | POA: Insufficient documentation

## 2013-02-23 DIAGNOSIS — Z8659 Personal history of other mental and behavioral disorders: Secondary | ICD-10-CM | POA: Insufficient documentation

## 2013-02-23 DIAGNOSIS — J45909 Unspecified asthma, uncomplicated: Secondary | ICD-10-CM | POA: Insufficient documentation

## 2013-02-23 HISTORY — DX: Essential (primary) hypertension: I10

## 2013-02-23 LAB — URINALYSIS, ROUTINE W REFLEX MICROSCOPIC
BILIRUBIN URINE: NEGATIVE
GLUCOSE, UA: NEGATIVE mg/dL
Ketones, ur: NEGATIVE mg/dL
LEUKOCYTES UA: NEGATIVE
Nitrite: NEGATIVE
PH: 6 (ref 5.0–8.0)
Protein, ur: NEGATIVE mg/dL
SPECIFIC GRAVITY, URINE: 1.024 (ref 1.005–1.030)
UROBILINOGEN UA: 0.2 mg/dL (ref 0.0–1.0)

## 2013-02-23 LAB — URINE MICROSCOPIC-ADD ON

## 2013-02-23 LAB — PREGNANCY, URINE: PREG TEST UR: NEGATIVE

## 2013-02-23 LAB — COMPREHENSIVE METABOLIC PANEL
ALK PHOS: 88 U/L (ref 39–117)
ALT: 16 U/L (ref 0–35)
AST: 24 U/L (ref 0–37)
Albumin: 3.7 g/dL (ref 3.5–5.2)
BILIRUBIN TOTAL: 0.4 mg/dL (ref 0.3–1.2)
BUN: 8 mg/dL (ref 6–23)
CO2: 21 mEq/L (ref 19–32)
CREATININE: 0.89 mg/dL (ref 0.50–1.10)
Calcium: 9.2 mg/dL (ref 8.4–10.5)
Chloride: 103 mEq/L (ref 96–112)
GFR, EST NON AFRICAN AMERICAN: 85 mL/min — AB (ref >90)
GLUCOSE: 100 mg/dL — AB (ref 70–99)
Potassium: 3.7 mEq/L (ref 3.7–5.3)
Sodium: 138 mEq/L (ref 137–147)
Total Protein: 7.1 g/dL (ref 6.0–8.3)

## 2013-02-23 LAB — CBC WITH DIFFERENTIAL/PLATELET
BASOS ABS: 0 10*3/uL (ref 0.0–0.1)
Basophils Relative: 0 % (ref 0–1)
Eosinophils Absolute: 0.1 10*3/uL (ref 0.0–0.7)
Eosinophils Relative: 3 % (ref 0–5)
HEMATOCRIT: 40.7 % (ref 36.0–46.0)
Hemoglobin: 14.2 g/dL (ref 12.0–15.0)
LYMPHS ABS: 0.7 10*3/uL (ref 0.7–4.0)
Lymphocytes Relative: 15 % (ref 12–46)
MCH: 32.5 pg (ref 26.0–34.0)
MCHC: 34.9 g/dL (ref 30.0–36.0)
MCV: 93.1 fL (ref 78.0–100.0)
MONO ABS: 0.6 10*3/uL (ref 0.1–1.0)
MONOS PCT: 13 % — AB (ref 3–12)
NEUTROS PCT: 69 % (ref 43–77)
Neutro Abs: 3.2 10*3/uL (ref 1.7–7.7)
Platelets: 267 10*3/uL (ref 150–400)
RBC: 4.37 MIL/uL (ref 3.87–5.11)
RDW: 12.4 % (ref 11.5–15.5)
WBC: 4.6 10*3/uL (ref 4.0–10.5)

## 2013-02-23 MED ORDER — BENZONATATE 100 MG PO CAPS: 100.0000 mg | ORAL_CAPSULE | Freq: Three times a day (TID) | ORAL | Status: DC

## 2013-02-23 MED ORDER — OSELTAMIVIR PHOSPHATE 75 MG PO CAPS: 75.0000 mg | ORAL_CAPSULE | Freq: Two times a day (BID) | ORAL | Status: DC

## 2013-02-23 MED ORDER — SODIUM CHLORIDE 0.9 % IV BOLUS (SEPSIS)
1000.0000 mL | Freq: Once | INTRAVENOUS | Status: AC
  Administered 2013-02-23: 1000 mL via INTRAVENOUS

## 2013-02-23 MED ORDER — ACETAMINOPHEN 325 MG PO TABS
650.0000 mg | ORAL_TABLET | Freq: Four times a day (QID) | ORAL | Status: DC | PRN
  Filled 2013-02-23: qty 2

## 2013-02-23 MED ORDER — IBUPROFEN 600 MG PO TABS: 600.0000 mg | ORAL_TABLET | Freq: Four times a day (QID) | ORAL | Status: DC | PRN

## 2013-02-23 MED ORDER — OSELTAMIVIR PHOSPHATE 75 MG PO CAPS
75.0000 mg | ORAL_CAPSULE | Freq: Once | ORAL | Status: AC
  Administered 2013-02-23: 75 mg via ORAL
  Filled 2013-02-23: qty 1

## 2013-02-23 MED ORDER — LISINOPRIL 5 MG PO TABS: 5.0000 mg | ORAL_TABLET | Freq: Every day | ORAL | Status: DC

## 2013-02-23 MED ORDER — ACETAMINOPHEN 325 MG PO TABS
650.0000 mg | ORAL_TABLET | Freq: Once | ORAL | Status: AC
  Administered 2013-02-23: 650 mg via ORAL

## 2013-02-23 NOTE — ED Notes (Addendum)
Per EMS report: pt from home: c/o of flu-like sx.  Pt reports a fever of 103 while EMS got 99.7.  Pt endorses productive cough, generalized body aches, and pain upon palpation and inspiration of chest. Pt a/o x 4.  Skin slightly warm to the to touch. Pt took ibuprofen at around 18:00. EMS VS: BP:224/110, HR: 130, RR: 18

## 2013-02-23 NOTE — ED Provider Notes (Signed)
CSN: [id number]     Arrival date & time 02/23/13  0021 History   First MD Initiated Contact with Patient 02/23/13 0024     Chief Complaint  Patient presents with  . Hypertension  . Fever     (Consider location/radiation/quality/duration/timing/severity/associated sxs/prior Treatment) HPI Patient presents with 2 days of fever up to 103, cough with minimal sputum production, generalized body aches and myalgias, diaphoresis, generalized fatigue, and gradual onset headache. Patient's had no neck pain or stiffness. Patient has had some rhinorrhea but denies sore throat. Patient denies shortness of breath. He said no abdominal pain, nausea, vomiting. She denies urinary symptoms, vaginal bleeding or discharge. She states she she has had sick contacts at work with the flu. He's also been diagnosed with a hypertension in the past but stopped taking her blood pressure medication. Past Medical History  Diagnosis Date  . Asthma   . Depression   . Hypertension    History reviewed. No pertinent past surgical history. No family history on file. History  Substance Use Topics  . Smoking status: Former Smoker -- 1.00 packs/day    Types: Cigarettes  . Smokeless tobacco: Not on file  . Alcohol Use: No   OB History   Grav Para Term Preterm Abortions TAB SAB Ect Mult Living                 Review of Systems  Constitutional: Positive for fever, chills, diaphoresis and fatigue.  HENT: Positive for rhinorrhea. Negative for sore throat.   Respiratory: Positive for cough. Negative for shortness of breath and wheezing.   Cardiovascular: Negative for palpitations and leg swelling.  Gastrointestinal: Negative for nausea, vomiting and abdominal pain.  Genitourinary: Negative for dysuria, hematuria and flank pain.  Musculoskeletal: Positive for myalgias. Negative for arthralgias, neck pain and neck stiffness.  Skin: Negative for rash and wound.  Neurological: Positive for headaches. Negative for dizziness,  syncope, weakness, light-headedness and numbness.  All other systems reviewed and are negative.      Allergies  Review of patient's allergies indicates no known allergies.  Home Medications   Current Outpatient Rx  Name  Route  Sig  Dispense  Refill  . guaifenesin (ROBITUSSIN) 100 MG/5ML syrup   Oral   Take 200 mg by mouth 3 (three) times daily as needed for cough.         Marland Kitchen ibuprofen (ADVIL,MOTRIN) 200 MG tablet   Oral   Take 400 mg by mouth every 8 (eight) hours as needed for fever.         . nicotine (NICODERM CQ - DOSED IN MG/24 HOURS) 21 mg/24hr patch   Transdermal   Place 21 mg onto the skin daily.         Marland Kitchen Phenyleph-CPM-DM-APAP (ALKA-SELTZER PLUS COLD & COUGH) 05-10-08-325 MG CAPS   Oral   Take 2 capsules by mouth every 6 (six) hours as needed (for cold symptoms).          BP 175/107  Pulse 118  Temp(Src) 100.6 F (38.1 C) (Oral)  Resp 15  Ht 5\' 3"  (1.6 m)  Wt 210 lb (95.255 kg)  BMI 37.21 kg/m2  SpO2 94%  LMP 01/09/2013 Physical Exam  Nursing note and vitals reviewed. Constitutional: She is oriented to person, place, and time. She appears well-developed and well-nourished. No distress.  HENT:  Head: Normocephalic and atraumatic.  Mouth/Throat: Oropharynx is clear and moist. No oropharyngeal exudate.  Eyes: EOM are normal. Pupils are equal, round, and reactive to light.  Neck: Normal range of motion. Neck supple.  No meningismus  Cardiovascular: Regular rhythm.   Tachycardia  Pulmonary/Chest: Effort normal and breath sounds normal. No respiratory distress. She has no wheezes. She has no rales. She exhibits tenderness (anterior chest wall tenderness. Crepitance or deformity.).  Abdominal: Soft. Bowel sounds are normal. She exhibits no distension and no mass. There is no tenderness. There is no rebound and no guarding.  Musculoskeletal: Normal range of motion. She exhibits no edema and no tenderness.  Neurological: She is alert and oriented to  person, place, and time.  Patient is alert and oriented x3 with clear, goal oriented speech. Patient has 5/5 motor in all extremities. Sensation is intact to light touch.    Skin: Skin is warm and dry. No rash noted. No erythema.  Psychiatric: She has a normal mood and affect. Her behavior is normal.    ED Course  Procedures (including critical care time) Labs Review Labs Reviewed  CBC WITH DIFFERENTIAL  COMPREHENSIVE METABOLIC PANEL  URINALYSIS, ROUTINE W REFLEX MICROSCOPIC  PREGNANCY, URINE   Imaging Review No results found.  EKG Interpretation   None       MDM   Final diagnoses:  None    Patient says she is feeling much better. Her vital signs have improved. She's been encouraged to followup with primary doctor regarding her elevated blood pressure. Return precautions have been given and patient voiced understanding.    Loren Raceravid Suttyn Cryder, MD 02/23/13 [phone]

## 2013-02-23 NOTE — Discharge Instructions (Signed)
Take medications as prescribed. Followup with your primary Dr. to reevaluate your blood pressure. Her blood pressure medication may need to be adjusted. Return immediately to the emergency department for any concerns  Influenza, Adult Influenza ("the flu") is a viral infection of the respiratory tract. It occurs more often in winter months because people spend more time in close contact with one another. Influenza can make you feel very sick. Influenza easily spreads from person to person (contagious). CAUSES  Influenza is caused by a virus that infects the respiratory tract. You can catch the virus by breathing in droplets from an infected person's cough or sneeze. You can also catch the virus by touching something that was recently contaminated with the virus and then touching your mouth, nose, or eyes. SYMPTOMS  Symptoms typically last 4 to 10 days and may include:  Fever.  Chills.  Headache, body aches, and muscle aches.  Sore throat.  Chest discomfort and cough.  Poor appetite.  Weakness or feeling tired.  Dizziness.  Nausea or vomiting. DIAGNOSIS  Diagnosis of influenza is often made based on your history and a physical exam. A nose or throat swab test can be done to confirm the diagnosis. RISKS AND COMPLICATIONS You may be at risk for a more severe case of influenza if you smoke cigarettes, have diabetes, have chronic heart disease (such as heart failure) or lung disease (such as asthma), or if you have a weakened immune system. Elderly people and pregnant women are also at risk for more serious infections. The most common complication of influenza is a lung infection (pneumonia). Sometimes, this complication can require emergency medical care and may be life-threatening. PREVENTION  An annual influenza vaccination (flu shot) is the best way to avoid getting influenza. An annual flu shot is now routinely recommended for all adults in the [place] TREATMENT  In mild cases,  influenza goes away on its own. Treatment is directed at relieving symptoms. For more severe cases, your caregiver may prescribe antiviral medicines to shorten the sickness. Antibiotic medicines are not effective, because the infection is caused by a virus, not by bacteria. HOME CARE INSTRUCTIONS  Only take over-the-counter or prescription medicines for pain, discomfort, or fever as directed by your caregiver.  Use a cool mist humidifier to make breathing easier.  Get plenty of rest until your temperature returns to normal. This usually takes 3 to 4 days.  Drink enough fluids to keep your urine clear or pale yellow.  Cover your mouth and nose when coughing or sneezing, and wash your hands well to avoid spreading the virus.  Stay home from work or school until your fever has been gone for at least 1 full day. SEEK MEDICAL CARE IF:   You have chest pain or a deep cough that worsens or produces more mucus.  You have nausea, vomiting, or diarrhea. SEEK IMMEDIATE MEDICAL CARE IF:   You have difficulty breathing, shortness of breath, or your skin or nails turn bluish.  You have severe neck pain or stiffness.  You have a severe headache, facial pain, or earache.  You have a worsening or recurring fever.  You have nausea or vomiting that cannot be controlled. MAKE SURE YOU:  Understand these instructions.  Will watch your condition.  Will get help right away if you are not doing well or get worse. Document Released: 12/24/1999 Document Revised: 06/27/2011 Document Reviewed: 03/27/2011 [facility] Patient Information 2014 [place], [place].  Hypertension As your heart beats, it forces blood through your arteries.  This force is your blood pressure. If the pressure is too high, it is called hypertension (HTN) or high blood pressure. HTN is dangerous because you may have it and not know it. High blood pressure may mean that your heart has to work harder to pump blood. Your arteries may be  narrow or stiff. The extra work puts you at risk for heart disease, stroke, and other problems.  Blood pressure consists of two numbers, a higher number over a lower, 110/72, for example. It is stated as "110 over 72." The ideal is below 120 for the top number (systolic) and under 80 for the bottom (diastolic). Write down your blood pressure today. You should pay close attention to your blood pressure if you have certain conditions such as:  Heart failure.  Prior heart attack.  Diabetes  Chronic kidney disease.  Prior stroke.  Multiple risk factors for heart disease. To see if you have HTN, your blood pressure should be measured while you are seated with your arm held at the level of the heart. It should be measured at least twice. A one-time elevated blood pressure reading (especially in the Emergency Department) does not mean that you need treatment. There may be conditions in which the blood pressure is different between your right and left arms. It is important to see your caregiver soon for a recheck. Most people have essential hypertension which means that there is not a specific cause. This type of high blood pressure may be lowered by changing lifestyle factors such as:  Stress.  Smoking.  Lack of exercise.  Excessive weight.  Drug/tobacco/alcohol use.  Eating less salt. Most people do not have symptoms from high blood pressure until it has caused damage to the body. Effective treatment can often prevent, delay or reduce that damage. TREATMENT  When a cause has been identified, treatment for high blood pressure is directed at the cause. There are a large number of medications to treat HTN. These fall into several categories, and your caregiver will help you select the medicines that are best for you. Medications may have side effects. You should review side effects with your caregiver. If your blood pressure stays high after you have made lifestyle changes or started on  medicines,   Your medication(s) may need to be changed.  Other problems may need to be addressed.  Be certain you understand your prescriptions, and know how and when to take your medicine.  Be sure to follow up with your caregiver within the time frame advised (usually within two weeks) to have your blood pressure rechecked and to review your medications.  If you are taking more than one medicine to lower your blood pressure, make sure you know how and at what times they should be taken. Taking two medicines at the same time can result in blood pressure that is too low. SEEK IMMEDIATE MEDICAL CARE IF:  You develop a severe headache, blurred or changing vision, or confusion.  You have unusual weakness or numbness, or a faint feeling.  You have severe chest or abdominal pain, vomiting, or breathing problems. MAKE SURE YOU:   Understand these instructions.  Will watch your condition.  Will get help right away if you are not doing well or get worse. Document Released: 12/26/2004 Document Revised: 03/20/2011 Document Reviewed: 08/16/2007 [facility] Patient Information 2014 [place], [place].  Emergency Department Resource Guide 1) Find a Doctor and Pay Out of Pocket Although you won't have to find out who is covered by your  insurance plan, it is a good idea to ask around and get recommendations. You will then need to call the office and see if the doctor you have chosen will accept you as a new patient and what types of options they offer for patients who are self-pay. Some doctors offer discounts or will set up payment plans for their patients who do not have insurance, but you will need to ask so you aren't surprised when you get to your appointment.  2) Contact Your Local Health Department Not all health departments have doctors that can see patients for sick visits, but many do, so it is worth a call to see if yours does. If you don't know where your local health department is, you can  check in your phone book. The CDC also has a tool to help you locate your state's health department, and many state websites also have listings of all of their local health departments.  3) Find a [facility] If your illness is not likely to be very severe or complicated, you may want to try a walk in clinic. These are popping up all over the country in pharmacies, drugstores, and shopping centers. They're usually staffed by nurse practitioners or physician assistants that have been trained to treat common illnesses and complaints. They're usually fairly quick and inexpensive. However, if you have serious medical issues or chronic medical problems, these are probably not your best option.  No Primary Care Doctor: - Call Health Connect at  [phone] - they can help you locate a primary care doctor that  accepts your insurance, provides certain services, etc. - Physician Referral Service- [phone]  Chronic Pain Problems: Organization         Address  Phone   Notes  [facility]  [phone] Patients need to be referred by their primary care doctor.   Medication Assistance: Organization         Address  Phone   Notes  [facility] Medication [address] [place], [place] [address] [phone] --Must be a resident of [facility] -- Must have NO insurance coverage whatsoever (no Medicaid/ Medicare, etc.) -- The pt. MUST have a primary care doctor that directs their care regularly and follows them in the community   Med[facility]ist  [phone]   [employer]  [phone]    Agencies that provide inexpensive medical care: Organization         Address  Phone   Notes  [place]  [phone]   [facility]    [phone]   [facility] [place], [place] [address] [phone]   [place] [address] [place] [phone]    Planned Parenthood    [phone]   [facility]    [phone]   [place] [address], [place] Phone:  [phone], Fax:  [phone] Hours of Operation:  9 am - 6 pm, M-F.  Also accepts Medicaid/Medicare and self-pay.  [facility] for [place], [address],  Phone: [phone], Fax: [phone]. Hours of Operation:  8:30 am - 5:30 pm, M-F.  Also accepts Medicaid and self-pay.  [facility] [place] [address], [place] Phone: [phone]   [place], [place], [place] [phone], Ext. [phone] Mondays &  Thursdays: 7-9 AM.  First 15 patients are seen on a first come, first serve basis.    [place] Providers:  Organization         Address  Phone   Notes  [facility] [address], [place] [phone] Also accepts self-pay patients.  [facility] [id number] [place], [place]  [phone]   [place], [address], [place] [phone]   [facility] [address] [place] [phone]   Lucianne Lei [address], [place]   [phone] Only accepts [place] Access [place] patients after they have their name applied to their card.   Self-Pay (no insurance) in [facility]:  Organization         Address  Phone   Notes  Sickle Cell Patients, [facility] [place] [phone]   [facility] [place] [phone]   [facility] [place]  [place], [place], [place] [phone]   [facility]/Dr. Osei-Bonsu  [address] [place] or [address], [place] [phone] Phone number for both [place] and [place] locations is the  same.  [facility] [address], [place] [phone]   [facility] [address] [place] or [address] [phone]   [facility] [address], [place] [phone], phone; [phone], fax Sees patients 1st and 3rd Saturday of every month.  Must not qualify for public or private insurance (i.e. Medicaid, Medicare, [place] Health Choice, Veterans' Benefits)  Household income should be no more than 200% of the poverty level The clinic cannot treat you if you are pregnant or think you are pregnant  Sexually transmitted diseases are not treated at the clinic.    Dental Care: Organization         Address  Phone  Notes  [facility] Department of [facility] [place] [phone] Accepts children up to age 57 who are enrolled in [place] or [place]; pregnant women with a Medicaid card; and children who have applied for Medicaid or [place] Health Choice, but were declined, whose parents can pay a reduced fee at time of service.  [facility] Department of [facility]  [address], [place] [phone] Accepts children up to age 68 who are enrolled in [place] or [place]; pregnant women with a Medicaid card; and children who have applied for Medicaid or [place] Health Choice, but were declined, whose parents can pay a reduced fee at time of service.  [place] Adult Dental Access PROGRAM  [place] [phone] Patients are seen by appointment only. Walk-ins are not accepted. [place] will see patients 12 years of age and older. Monday - Tuesday (8am-5pm) Most Wednesdays (8:30-5pm) $30 per visit, cash only  [facility] Adult Dental Access PROGRAM  [address], [facility] [phone] Patients are seen by appointment only. Walk-ins are not accepted. [place] will see patients  76 years of age and older. One Wednesday Evening (Monthly: Volunteer Based).  $30 per visit, cash only  [place]  [phone] for adults; Children under  age 23, call Graduate Pediatric Dentistry at [phone]. Children aged 34-14, please call [phone] to request a pediatric application.  Dental services are provided in all areas of dental care including fillings, crowns and bridges, complete and partial dentures, implants, gum treatment, root canals, and extractions. Preventive care is also provided. Treatment is provided to both adults and children. Patients are selected via a lottery and there is often a waiting list.   [place] [address] [place]  [phone] [email or web address]   [facility] [address] [place], [place] [phone], Ext. [phone] Second and Fourth Thursday of each month, opens at 6:30 AM; Clinic ends at 9 AM.  Patients are seen on a first-come first-served basis, and a limited number are seen during each clinic.   [facility]  [address] Hillard Danker [place], [place] [phone]   Eligibility Requirements You must have lived in [place], [place], or [place] counties for at least the last three months.   You cannot be eligible for state or federal sponsored [employer], including [employer], [place], or [employer].   You generally cannot be eligible for healthcare insurance through your employer.    How to apply: Eligibility screenings are held every Tuesday and Wednesday afternoon from 1:00 pm until 4:00 pm. You do not need an appointment for the interview!  [facility] [address] [place], [place]   [place]  [place] Department  [place]  [phone]    Behavioral Health Resources in the Community: Intensive Outpatient  Programs Organization         Address  Phone  Notes  [place]. [address], [place], [place] [phone]   [facility] Outpatient [address] [place], [place]   ADS: Alcohol & Drug Svcs [address], [place], [place]   [place] [address]  [place], [place] or [phone]   Substance Abuse Resources Organization         Address  Phone  Notes  Alcohol and Drug Services  [phone]   [place]  [phone]   [facility] [place]   Chinita Pester  [phone]   Residential & Outpatient Substance Abuse Program  [phone]   Psychological Services Organization         Address  Phone  Notes  [facility]  [place]  [phone]   [place] [address] [place] or [phone]    Mobile Crisis Teams Organization         Address  Phone  Notes  [facility], Mobile Crisis Care Unit  [phone]   [facility]  [address] [place], [place]   [place] [address] [place] [phone]    Self-Help/Support Groups Organization         Address  Phone             Notes  [place]. of [place] - variety of support groups  [place] Call for more information  Narcotics Anonymous (NA), [facility] [address], [employer] [place]  2 meetings at this location   Brewing technologist  Notes  [facility] Residential Treatment [phone] [place]  Mardene Speak [place]  [place]  [address] [place] [id number], [place], [place]   [place], [place] [phone] Admissions: 8am-3pm M-F  Incentives Substance [place] [address]    [place], [place]  [phone]   [facility] [address] [place], [place], [place]   [facility] [address],  [place], [place]   [facility] Programs - Intensive Outpatient [address] Kristeen Mans [address], [place], [place]   [facility] [place].,  [place], [place] [phone] or [phone]   [facility] Services (RTS) [address], [place], [place] Accepts Medicaid  [facility] [place] [address]  [place] [phone] Substance Abuse/Addiction Treatment   [facility] Organization         Address  Phone  Notes  [facility]  [phone]   Domenic Schwab, PhD [address] Arlis Porta [place], [place]   [phone] or [phone]   [place], [place] [phone]   [facility] [address] [place], [place] [phone] Insurance/Medicaid/sponsorship through [facility] and Families [address] [place]                                    [place], [place] [phone] [place] [address], [place] [phone]    Dr. Adele Schilder  [phone]   [facility] [place] Dept. 1) [address] [place] 2) [place] 3)  [place] [address], [place] [phone]  [phone]   [place] [phone] or [phone] (After Hours)

## 2013-02-23 NOTE — ED Notes (Signed)
Bed: WA17 Expected date:  Expected time:  Means of arrival:  Comments: Flu like sxs, hypertension

## 2013-10-24 ENCOUNTER — Other Ambulatory Visit: Payer: Self-pay

## 2016-02-01 IMAGING — CR DG CHEST 2V
2 series · 2 of 2 positions shown · non-contrast
Comparison: Prior chest x-ray 11/08/2008

CLINICAL DATA: Flu-like symptoms, high fever

EXAM:
CHEST  2 VIEW

[w chest pa]
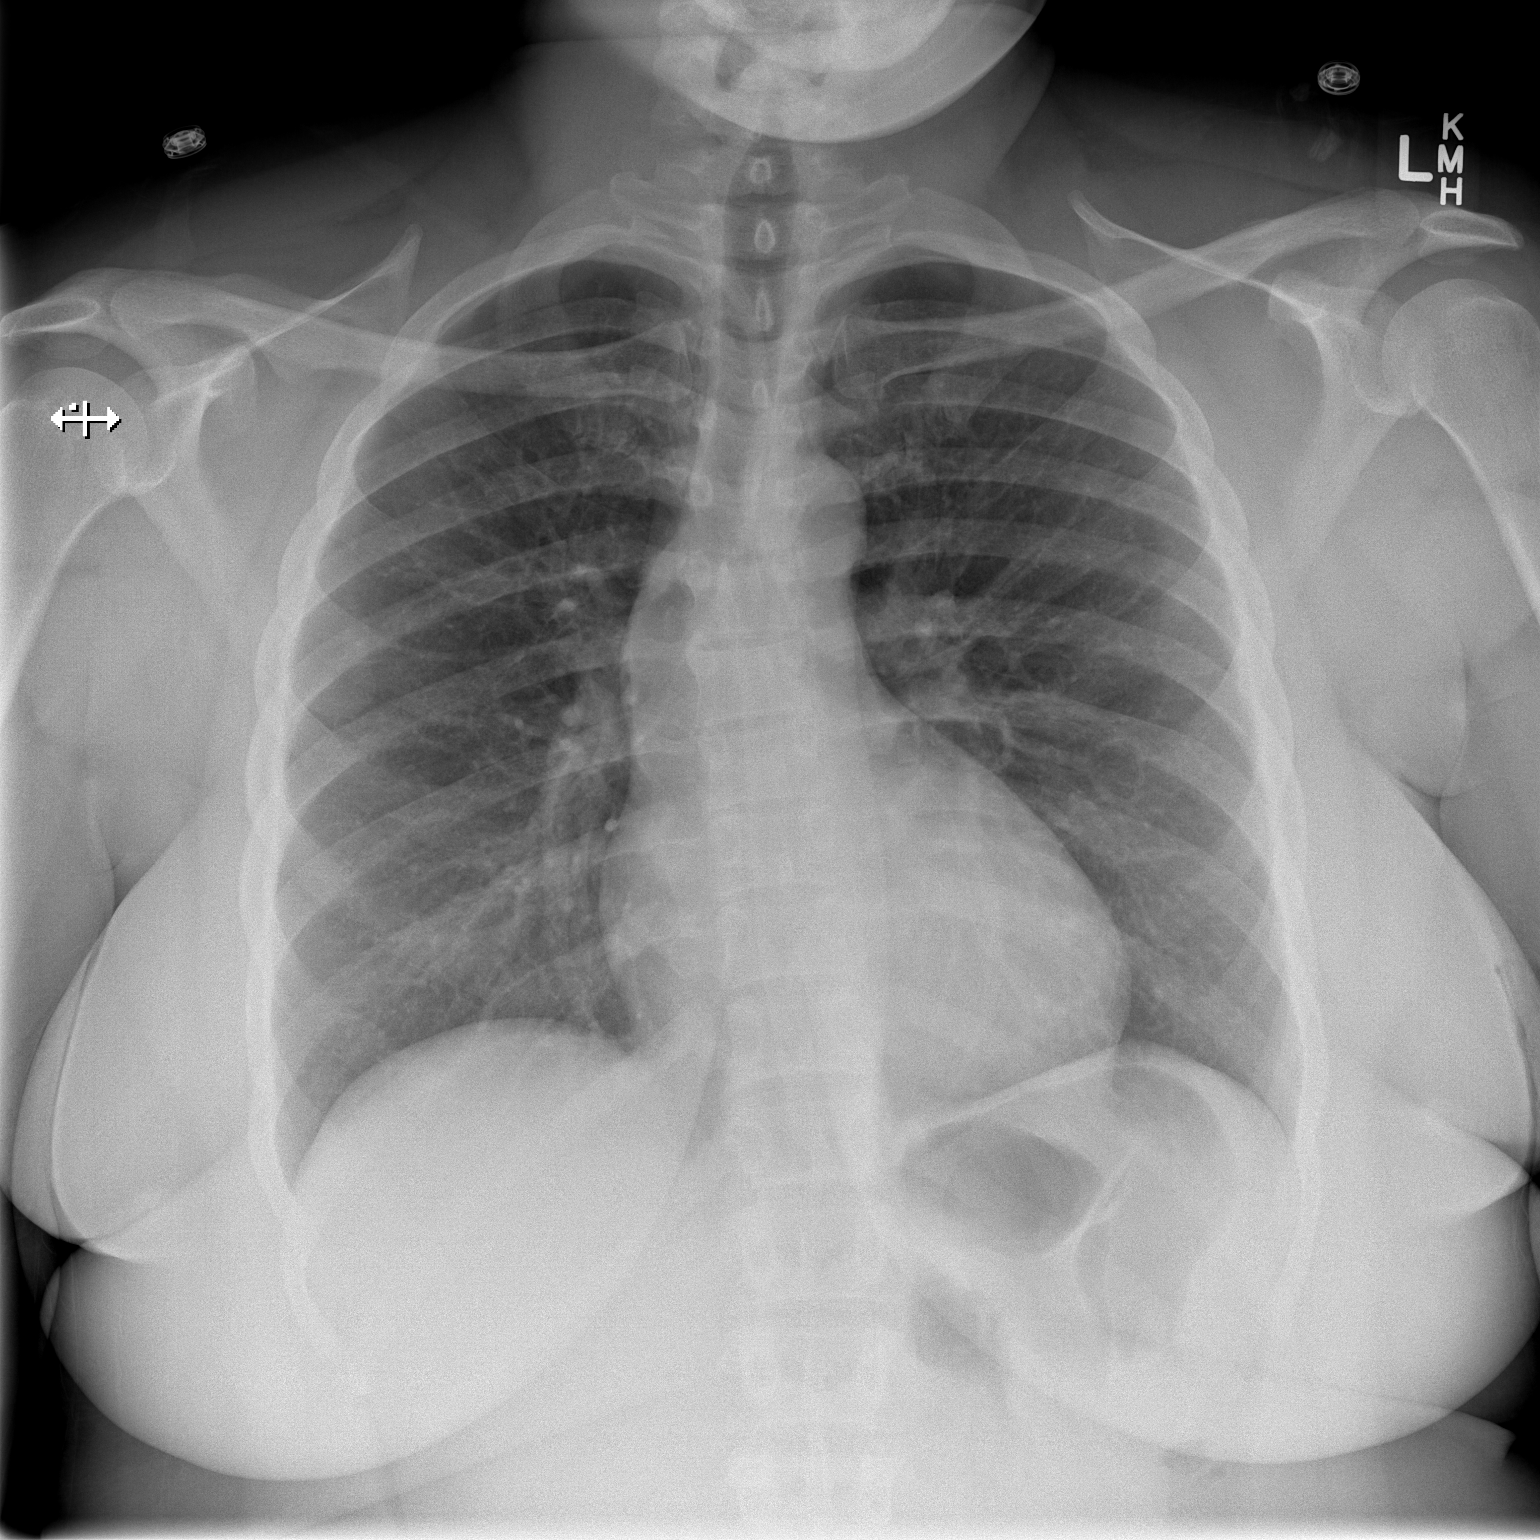

[w chest lat]
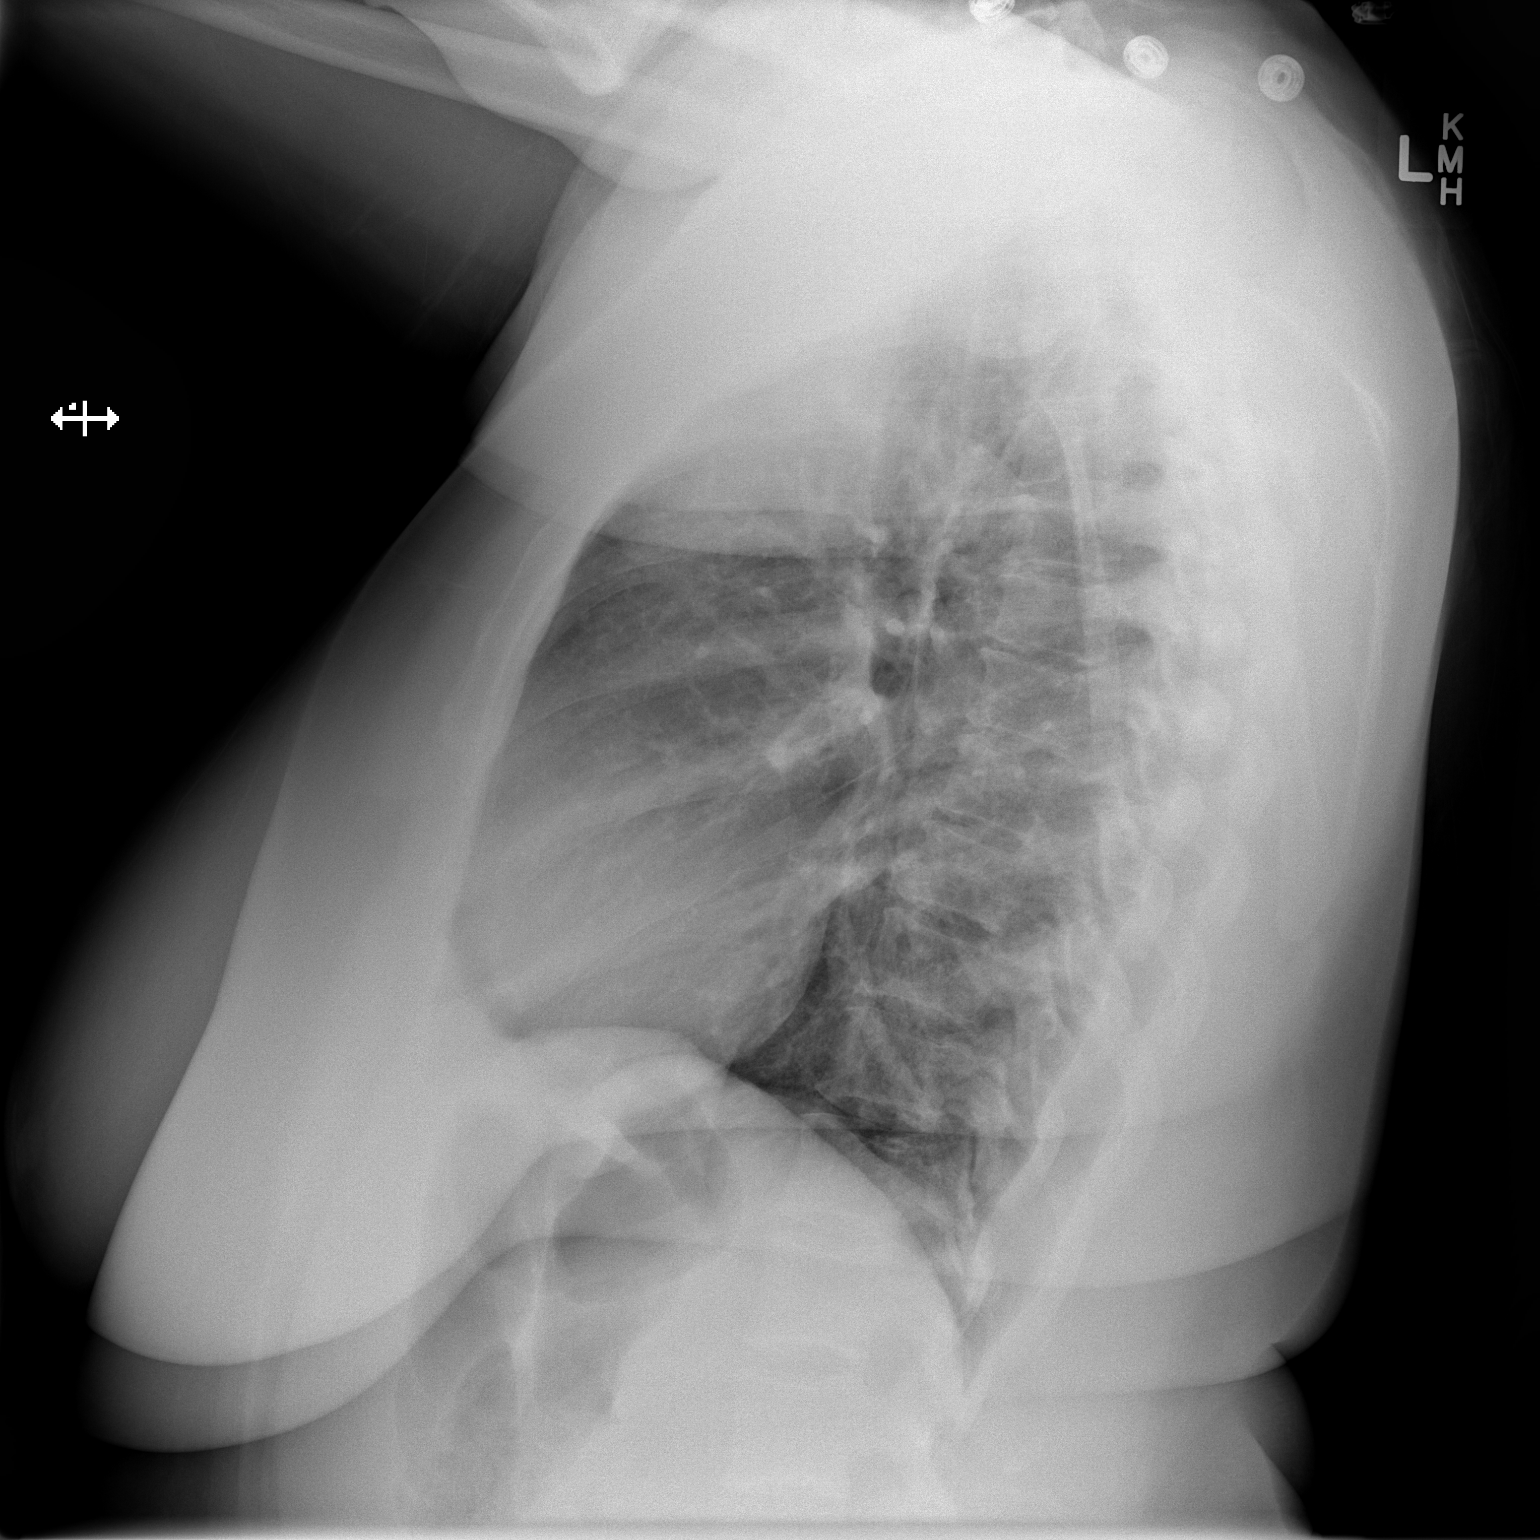

[2 of 2 positions shown; findings below may reference images not displayed]

FINDINGS: The lungs are clear and negative for focal airspace consolidation,
pulmonary edema or suspicious pulmonary nodule. No pleural effusion
or pneumothorax. Cardiac and mediastinal contours are within normal
limits. No acute fracture or lytic or blastic osseous lesions. The
visualized upper abdominal bowel gas pattern is unremarkable.
IMPRESSION: No active cardiopulmonary disease.

## 2018-09-20 ENCOUNTER — Other Ambulatory Visit: Payer: Self-pay

## 2018-09-20 ENCOUNTER — Encounter (HOSPITAL_COMMUNITY): Payer: Self-pay | Admitting: Emergency Medicine

## 2018-09-20 ENCOUNTER — Ambulatory Visit (HOSPITAL_COMMUNITY)
Admission: EM | Admit: 2018-09-20 | Discharge: 2018-09-20 | Disposition: A | Discharge status: Home / Self Care | Payer: 59 | Attending: Emergency Medicine | Admitting: Emergency Medicine

## 2018-09-20 DIAGNOSIS — R21 Rash and other nonspecific skin eruption: Secondary | ICD-10-CM | POA: Diagnosis present

## 2018-09-20 NOTE — ED Provider Notes (Signed)
[place]    CSN: [id number] Arrival date & time: 09/20/18  1039      History   Chief Complaint Chief Complaint  Patient presents with  . Rash    HPI Katherine Knight is a 38 y.o. female.   Katherine Knight presents with complaints of rash, primarily to hands, palms, and feet, but also to her lips. States she has had recurrent episodes of this for years, with evaluation by her PCP as well as dermatology. She has had biopsies of this even, which were inconclusive. States it comes in flares, which typically last for approximately 1 week. This flare has lasted longer, approximately 2 weeks now. Starts out itchy, and then it burns. The palms of her hands and soles of her feet burn. She has tried many different treatments over the years including topical steroids and oatmeal soaks which have not helped with symptoms. She has been teste for herpes, gonorrhea, hiv/aids, eczema with skin scraping. She is concerned that she doesn't know if she has ever been tested for syphilis, which prompted her to come today. She has not had any new exposures to products/soaps detergents or chemicals. She has no specific known exposure to syphilis but states in her past she has "been cavalier" so feels it is still within reason. No fevers, no body aches or joint aches. No drainage or oozing from the rash.hx of asthma, depression, htn.     ROS per HPI, negative if not otherwise mentioned.      Past Medical History:  Diagnosis Date  . Asthma   . Depression   . Hypertension     Patient Active Problem List   Diagnosis Date Noted  . Rash 06/26/2011  . EPIDERMOID CYST 08/11/2008  . BREAST MASS 07/08/2008  . ESSENTIAL HYPERTENSION, BENIGN 06/23/2008  . KNEE PAIN, LEFT 04/17/2008  . TRICHOMONAL VULVOVAGINITIS 11/19/2006  . OBESITY, NOS 03/08/2006  . TOBACCO DEPENDENCE 03/08/2006  . DEPRESSIVE DISORDER, NOS 03/08/2006  . RHINITIS, ALLERGIC 03/08/2006  . AMENORRHEA 03/08/2006  . ASTHMA,  INTERMITTENT 03/08/2006    History reviewed. No pertinent surgical history.  OB History   No obstetric history on file.      Home Medications    Prior to Admission medications   Medication Sig Start Date End Date Taking? Authorizing Provider  benzonatate (TESSALON) 100 MG capsule Take 1 capsule (100 mg total) by mouth every 8 (eight) hours. 02/23/13   Julianne Rice, MD  guaifenesin (ROBITUSSIN) 100 MG/5ML syrup Take 200 mg by mouth 3 (three) times daily as needed for cough.    [provider]  ibuprofen (ADVIL,MOTRIN) 200 MG tablet Take 400 mg by mouth every 8 (eight) hours as needed for fever.    [provider]  ibuprofen (ADVIL,MOTRIN) 600 MG tablet Take 1 tablet (600 mg total) by mouth every 6 (six) hours as needed. 02/23/13   Julianne Rice, MD  lisinopril (PRINIVIL,ZESTRIL) 5 MG tablet Take 1 tablet (5 mg total) by mouth daily. 02/23/13   Julianne Rice, MD  nicotine (NICODERM CQ - DOSED IN MG/24 HOURS) 21 mg/24hr patch Place 21 mg onto the skin daily.    [provider]  oseltamivir (TAMIFLU) 75 MG capsule Take 1 capsule (75 mg total) by mouth every 12 (twelve) hours. 02/23/13   Julianne Rice, MD  Phenyleph-CPM-DM-APAP (ALKA-SELTZER PLUS COLD & COUGH) 05-10-08-325 MG CAPS Take 2 capsules by mouth every 6 (six) hours as needed (for cold symptoms).    [provider]  Family History No family history on file.  Social History Social History   Tobacco Use  . Smoking status: Former Smoker    Packs/day: 1.00    Types: Cigarettes  Substance Use Topics  . Alcohol use: No  . Drug use: No     Allergies   Patient has no known allergies.   Review of Systems Review of Systems   Physical Exam Triage Vital Signs ED Triage Vitals  Enc Vitals Group     BP 09/20/18 1156 (!) 174/124     Pulse Rate 09/20/18 1156 62     Resp 09/20/18 1156 18     Temp 09/20/18 1156 98.9 F (37.2 C)     Temp src --      SpO2 09/20/18 1156 99 %      Weight --      Height --      Head Circumference --      Peak Flow --      Pain Score 09/20/18 1157 0     Pain Loc --      Pain Edu? --      Excl. in [place]? --    No data found.  Updated Vital Signs BP (!) 174/124   Pulse 62   Temp 98.9 F (37.2 C)   Resp 18   LMP 08/20/2018   SpO2 99%    Physical Exam Constitutional:      General: She is not in acute distress.    Appearance: She is well-developed.  Cardiovascular:     Rate and Rhythm: Normal rate.  Pulmonary:     Effort: Pulmonary effort is normal.  Skin:    General: Skin is warm and dry.     Findings: Rash present.     Comments: Raised circular rash primarily to dorsal thumbs with hyperpigmentation to center; no pustules; palms are reddened; similar lesions to soles of feet, some with flaking appearance;  Lips appear mildly inflamed with borders hyperpigmented lesions; see photos   Neurological:     Mental Status: She is alert and oriented to person, place, and time.            UC Treatments / Results  Labs (all labs ordered are listed, but only abnormal results are displayed) Labs Reviewed  HIV ANTIBODY (ROUTINE TESTING W REFLEX)  RPR    EKG   Radiology No results found.  Procedures Procedures (including critical care time)  Medications Ordered in UC Medications - No data to display  Initial Impression / Assessment and Plan / UC Course  I have reviewed the triage vital signs and the nursing notes.  Pertinent labs & imaging results that were available during my care of the patient were reviewed by me and considered in my medical decision making (see chart for details).     Concern for syphilis, RPR collected and pending, will repeat HIV as well. No new treatment at this time. If negative will need to follow up with dermatology for further evaluation of this recurring rash.  Final Clinical Impressions(s) / UC Diagnoses   Final diagnoses:  Rash     Discharge Instructions     We will test  for syphilis and hiv today as this palmar rash is concerning for possible syphilis.  Will notify you of any positive findings and if any changes to treatment are needed.   Please establish with a primary care provider for recheck as may needed further evaluation and treatment, especially if syphilis is negative and we still  have no diagnosis.  You also need your blood pressure rechecked    ED Prescriptions    None     Controlled Substance Prescriptions [place] Controlled Substance Registry consulted? Not Applicable   Georgetta HaberBurky,  B, NP 09/21/18 [phone]

## 2018-09-20 NOTE — Discharge Instructions (Addendum)
We will test for syphilis and hiv today as this palmar rash is concerning for possible syphilis.  Will notify you of any positive findings and if any changes to treatment are needed.   Please establish with a primary care provider for recheck as may needed further evaluation and treatment, especially if syphilis is negative and we still have no diagnosis.  You also need your blood pressure rechecked

## 2018-09-20 NOTE — ED Triage Notes (Addendum)
Pt states shes had off and on rash for over a year, states shes been tested for multiple different things, pt states she has NOT been tested for syphilis and would like to be tested for that. Rash on hands and feet.

## 2018-09-21 LAB — RPR: RPR Ser Ql: NONREACTIVE

## 2018-09-21 LAB — HIV ANTIBODY (ROUTINE TESTING W REFLEX): HIV Screen 4th Generation wRfx: NONREACTIVE

## 2019-05-01 ENCOUNTER — Ambulatory Visit
Admission: EM | Admit: 2019-05-01 | Discharge: 2019-05-01 | Disposition: A | Discharge status: Home / Self Care | Payer: Commercial Managed Care - PPO | Attending: Emergency Medicine | Admitting: Emergency Medicine

## 2019-05-01 DIAGNOSIS — I160 Hypertensive urgency: Secondary | ICD-10-CM | POA: Diagnosis not present

## 2019-05-01 MED ORDER — AMLODIPINE BESYLATE 5 MG PO TABS: 5.0000 mg | ORAL_TABLET | Freq: Every day | ORAL | 0 refills | Status: DC

## 2019-05-01 NOTE — Discharge Instructions (Signed)
Take amlodipine before bed. Follow up with PCP for further evaluation & medication management. Go to ER for chest pain, difficulty breathing, nausea, severe headache.

## 2019-05-01 NOTE — ED Provider Notes (Signed)
[facility]    CSN: [id number] Arrival date & time: 05/01/19  1045      History   Chief Complaint Chief Complaint  Patient presents with  . Hypertension    HPI Katherine Knight is a 39 y.o. female with history of asthma, depression, hypertension, obesity presenting for elevated blood pressure reading.  Patient states that she went to the dentist the other day to have a tooth pulled and was told that her blood pressure was too high to do procedure.  Patient endorsing history of hypertension, though has not taken medication since her early 60s due to not liking taking medications and adverse side effects.  Patient does not recall name of antihypertensives previously prescribed.  Patient states that she has lost weight over the years to try controlling this.  Patient denying change in vision, headache, chest pain, difficulty breathing, severe abdominal pain, hematuria.  Patient does endorse tobacco use: 1 PPD.   Past Medical History:  Diagnosis Date  . Asthma   . Depression   . Hypertension     Patient Active Problem List   Diagnosis Date Noted  . Rash 06/26/2011  . EPIDERMOID CYST 08/11/2008  . BREAST MASS 07/08/2008  . ESSENTIAL HYPERTENSION, BENIGN 06/23/2008  . KNEE PAIN, LEFT 04/17/2008  . TRICHOMONAL VULVOVAGINITIS 11/19/2006  . OBESITY, NOS 03/08/2006  . TOBACCO DEPENDENCE 03/08/2006  . DEPRESSIVE DISORDER, NOS 03/08/2006  . RHINITIS, ALLERGIC 03/08/2006  . AMENORRHEA 03/08/2006  . ASTHMA, INTERMITTENT 03/08/2006    History reviewed. No pertinent surgical history.  OB History   No obstetric history on file.      Home Medications    Prior to Admission medications   Medication Sig Start Date End Date Taking? Authorizing Provider  amLODipine (NORVASC) 5 MG tablet Take 1 tablet (5 mg total) by mouth daily. 05/01/19   Hall-Potvin, [place], PA-C    Family History History reviewed. No pertinent family history.  Social History Social History    Tobacco Use  . Smoking status: Current Every Day Smoker    Packs/day: 1.00    Types: Cigarettes  . Smokeless tobacco: Never Used  Substance Use Topics  . Alcohol use: Yes  . Drug use: No     Allergies   Patient has no known allergies.   Review of Systems As per HPI   Physical Exam Triage Vital Signs ED Triage Vitals  Enc Vitals Group     BP      Pulse      Resp      Temp      Temp src      SpO2      Weight      Height      Head Circumference      Peak Flow      Pain Score      Pain Loc      Pain Edu?      Excl. in [place]?    No data found.  Updated Vital Signs BP (!) 176/117 (BP Location: Left Arm)   Pulse 97   Temp 98.4 F (36.9 C) (Oral)   Resp 16   LMP 04/24/2019   SpO2 97%   Visual Acuity Right Eye Distance:   Left Eye Distance:   Bilateral Distance:    Right Eye Near:   Left Eye Near:    Bilateral Near:     Physical Exam Constitutional:      General: She is not in acute distress.    Appearance:  She is obese. She is not ill-appearing.  HENT:     Head: Normocephalic and atraumatic.     Mouth/Throat:     Mouth: Mucous membranes are moist.     Pharynx: Oropharynx is clear.  Eyes:     General: No scleral icterus.    Conjunctiva/sclera: Conjunctivae normal.     Pupils: Pupils are equal, round, and reactive to light.  Cardiovascular:     Rate and Rhythm: Normal rate and regular rhythm.     Heart sounds: No murmur. No gallop.   Pulmonary:     Effort: Pulmonary effort is normal. No respiratory distress.     Breath sounds: No wheezing or rales.  Musculoskeletal:        General: Normal range of motion.     Cervical back: Neck supple. No tenderness.     Right lower leg: No edema.     Left lower leg: No edema.  Lymphadenopathy:     Cervical: No cervical adenopathy.  Skin:    Capillary Refill: Capillary refill takes less than 2 seconds.     Coloration: Skin is not jaundiced or pale.     Findings: No bruising or rash.  Neurological:      General: No focal deficit present.     Mental Status: She is alert and oriented to person, place, and time.      UC Treatments / Results  Labs (all labs ordered are listed, but only abnormal results are displayed) Labs Reviewed - No data to display  EKG   Radiology No results found.  Procedures Procedures (including critical care time)  Medications Ordered in UC Medications - No data to display  Initial Impression / Assessment and Plan / UC Course  I have reviewed the triage vital signs and the nursing notes.  Pertinent labs & imaging results that were available during my care of the patient were reviewed by me and considered in my medical decision making (see chart for details).     Patient afebrile, nontoxic in office today.  Patient is hypertensive, though denying systemic symptoms as mentioned in HPI.  Counseled on smoking cessation; will start amlodipine, patient follow-up with PCP.  This provider coordinated care: Patient notes appointment with family medicine 4/29 at 3:30 PM.  Patient verbalized understanding, intends to keep this.  Return precautions discussed, patient verbalized understanding and is agreeable to plan. Final Clinical Impressions(s) / UC Diagnoses   Final diagnoses:  Hypertensive urgency     Discharge Instructions     Take amlodipine before bed. Follow up with PCP for further evaluation & medication management. Go to ER for chest pain, difficulty breathing, nausea, severe headache.    ED Prescriptions    Medication Sig Dispense Auth. Provider   amLODipine (NORVASC) 5 MG tablet Take 1 tablet (5 mg total) by mouth daily. 30 tablet Hall-Potvin, [place], PA-C     PDMP not reviewed this encounter.   Hall-Potvin, [place], [place] 05/01/19 1143

## 2019-05-01 NOTE — ED Triage Notes (Signed)
Pt states at the dentist to have a tooth pulled and her b/p was to high. Pt states hx of htn but doesn't take meds for it, controls with diet.

## 2019-05-07 ENCOUNTER — Telehealth: Payer: Self-pay

## 2019-05-07 NOTE — Telephone Encounter (Signed)

## 2019-05-07 NOTE — Patient Instructions (Signed)
Thank you for choosing Primary Care at [facility] to be your medical home!    Katherine Knight was seen by De Hollingshead, DO today.   Katherine Knight's primary care provider is Marcy Siren, DO.   For the best care possible, you should try to see Marcy Siren, DO whenever you come to the clinic.   We look forward to seeing you again soon!  If you have any questions about your visit today, please call us at [phone] or feel free to reach your primary care provider via MyChart.

## 2019-05-08 ENCOUNTER — Other Ambulatory Visit: Payer: Self-pay

## 2019-05-08 ENCOUNTER — Ambulatory Visit (INDEPENDENT_AMBULATORY_CARE_PROVIDER_SITE_OTHER): Payer: Commercial Managed Care - PPO | Admitting: Internal Medicine

## 2019-05-08 ENCOUNTER — Encounter: Payer: Self-pay | Admitting: Internal Medicine

## 2019-05-08 VITALS — BP 137/97 | HR 98 | Temp 97.5°F | Resp 17 | Ht 66.0 in | Wt 170.0 lb

## 2019-05-08 DIAGNOSIS — Z7689 Persons encountering health services in other specified circumstances: Secondary | ICD-10-CM | POA: Diagnosis not present

## 2019-05-08 DIAGNOSIS — I10 Essential (primary) hypertension: Secondary | ICD-10-CM | POA: Diagnosis not present

## 2019-05-08 DIAGNOSIS — F17200 Nicotine dependence, unspecified, uncomplicated: Secondary | ICD-10-CM | POA: Diagnosis not present

## 2019-05-08 MED ORDER — AMLODIPINE BESYLATE 10 MG PO TABS: 10.0000 mg | ORAL_TABLET | Freq: Every day | ORAL | 0 refills | Status: DC

## 2019-05-08 NOTE — Progress Notes (Signed)
Subjective:    Katherine Knight - 39 y.o. female MRN [id number]  Date of birth: 03-27-1980  HPI  Katherine Knight is to establish care. Patient has a PMH significant for HTN, asthma, and tobacco use.   Patient was seen in Urgent Care on 4/22 for hypertensive urgency that was not symptomatic. She as started on Amlodipine 5 mg.    She has been checking her BPs at home: 146/101, 176/120, 140/104, 140/100. Denies chest pain, SOB, vision changes, severe headaches. Has had an episode or two or feeling lightheaded when she quickly stands up. No syncope or persistent dizziness.   Had a history of known HTN. She was prescribed medications in her early 34s but said she was young and didn't want to take any medications.   ROS per HPI   Health Maintenance:  Health Maintenance Due  Topic Date Due  . COVID-19 Vaccine (1) Never done  . PAP SMEAR-Modifier  05/09/2004  . TETANUS/TDAP  06/09/2008     Past Medical History: Patient Active Problem List   Diagnosis Date Noted  . BREAST MASS 07/08/2008  . ESSENTIAL HYPERTENSION, BENIGN 06/23/2008  . OBESITY, NOS 03/08/2006  . TOBACCO DEPENDENCE 03/08/2006  . DEPRESSIVE DISORDER, NOS 03/08/2006  . ASTHMA, INTERMITTENT 03/08/2006      Social History   reports that she has been smoking cigarettes. She has been smoking about 1.00 pack per day. She has never used smokeless tobacco. She reports current alcohol use. She reports that she does not use drugs.   Family History  family history is not on file.   Medications: reviewed and updated   Objective:   Physical Exam BP (!) 137/97   Pulse 98   Temp (!) 97.5 F (36.4 C) (Temporal)   Resp 17   Ht 5\' 6"  (1.676 m)   Wt 170 lb (77.1 kg)   LMP 04/24/2019   SpO2 97%   BMI 27.44 kg/m  Physical Exam  Constitutional: She is oriented to person, place, and time and well-developed, well-nourished, and in no distress. No distress.  HENT:  Head: Normocephalic and atraumatic.  Eyes: Conjunctivae  and EOM are normal.  Cardiovascular: Normal rate, regular rhythm and normal heart sounds.  No murmur heard. Pulmonary/Chest: Effort normal and breath sounds normal. No respiratory distress.  Musculoskeletal:        General: Normal range of motion.  Neurological: She is alert and oriented to person, place, and time.  Skin: Skin is warm and dry. She is not diaphoretic.  Psychiatric: Affect and judgment normal.        Assessment & Plan:   1. Encounter to establish care   2. Essential hypertension BP remains above goal, particularly with diastolic results on home and in office reading. Will increase Amlodipine dose.  Counseled on blood pressure goal of less than 130/80, low-sodium, DASH diet, medication compliance, 150 minutes of moderate intensity exercise per week. Discussed medication compliance, adverse effects. - CBC with Differential - Comprehensive metabolic panel - amLODipine (NORVASC) 10 MG tablet; Take 1 tablet (10 mg total) by mouth daily.  Dispense: 90 tablet; Refill: 0  3. TOBACCO DEPENDENCE Patient currently smokes 1 ppd. She has decreased from Newports to the lowest nicotine dose of Marlboro lights. Spent 5 minutes counseling on dangers of tobacco use and benefits of quitting, offered pharmacological intervention to aid quitting and patient is ready to quit. However, she does not want to try any prescription methods right now. Nicotine replacement therapy would not be the best option  as we work to maintain control over her BP and she has had negative experience with the patch in the past. We discussed eliminating one cigarette per day using distraction, replacement methods for the oral fixation, etc.      Phill Myron, D.O. 05/08/2019, 3:39 PM Primary Care at [facility]

## 2019-05-09 LAB — CBC WITH DIFFERENTIAL/PLATELET
Basophils Absolute: 0 10*3/uL (ref 0.0–0.2)
Basos: 1 %
EOS (ABSOLUTE): 0.1 10*3/uL (ref 0.0–0.4)
Eos: 2 %
Hematocrit: 44.5 % (ref 34.0–46.6)
Hemoglobin: 15.2 g/dL (ref 11.1–15.9)
Immature Grans (Abs): 0 10*3/uL (ref 0.0–0.1)
Immature Granulocytes: 0 %
Lymphocytes Absolute: 2.3 10*3/uL (ref 0.7–3.1)
Lymphs: 44 %
MCH: 34.1 pg — ABNORMAL HIGH (ref 26.6–33.0)
MCHC: 34.2 g/dL (ref 31.5–35.7)
MCV: 100 fL — ABNORMAL HIGH (ref 79–97)
Monocytes Absolute: 0.5 10*3/uL (ref 0.1–0.9)
Monocytes: 10 %
Neutrophils Absolute: 2.2 10*3/uL (ref 1.4–7.0)
Neutrophils: 43 %
Platelets: 330 10*3/uL (ref 150–450)
RBC: 4.46 x10E6/uL (ref 3.77–5.28)
RDW: 12.1 % (ref 11.7–15.4)
WBC: 5.2 10*3/uL (ref 3.4–10.8)

## 2019-05-09 LAB — COMPREHENSIVE METABOLIC PANEL
ALT: 8 IU/L (ref 0–32)
AST: 14 IU/L (ref 0–40)
Albumin/Globulin Ratio: 2.1 (ref 1.2–2.2)
Albumin: 4.5 g/dL (ref 3.8–4.8)
Alkaline Phosphatase: 84 IU/L (ref 39–117)
BUN/Creatinine Ratio: 7 — ABNORMAL LOW (ref 9–23)
BUN: 7 mg/dL (ref 6–20)
Bilirubin Total: 0.5 mg/dL (ref 0.0–1.2)
CO2: 22 mmol/L (ref 20–29)
Calcium: 9.3 mg/dL (ref 8.7–10.2)
Chloride: 107 mmol/L — ABNORMAL HIGH (ref 96–106)
Creatinine, Ser: 0.98 mg/dL (ref 0.57–1.00)
GFR calc Af Amer: 85 mL/min/{1.73_m2} (ref >59)
GFR calc non Af Amer: 74 mL/min/{1.73_m2} (ref >59)
Globulin, Total: 2.1 g/dL (ref 1.5–4.5)
Glucose: 78 mg/dL (ref 65–99)
Potassium: 4.4 mmol/L (ref 3.5–5.2)
Sodium: 139 mmol/L (ref 134–144)
Total Protein: 6.6 g/dL (ref 6.0–8.5)

## 2019-05-09 NOTE — Progress Notes (Signed)
Normal lab letter mailed.

## 2019-06-03 ENCOUNTER — Telehealth: Payer: Self-pay

## 2019-06-03 NOTE — Telephone Encounter (Signed)
Called patient to do their pre-visit COVID screening.  Have you tested positive for COVID or are you currently waiting for COVID test results? no  Have you recently traveled internationally([place], [place], [place], [place], [place]) or within the [place] to a hotspot area([place], [place], [place], [place], [place])? no  Are you currently experiencing any of the following symptoms: fever, cough, SHOB, fatigue, body aches, loss of smell/taste, rash, diarrhea, vomiting, severe headaches, weakness, sore throat? no  Have you been in contact with anyone who has recently travelled? no  Have you been in contact with anyone who is experiencing any of the above symptoms or been diagnosed with COVID  or works in or has recently visited a SNF? no  Asked to come fasting for CPE labs. 

## 2019-06-03 NOTE — Patient Instructions (Addendum)
Congratulations on getting your Nexplanon placement!  Below is some important information about Nexplanon.  First remember that Nexplanon does not prevent sexually transmitted infections.  Condoms will help prevent sexually transmitted infections. The Nexplanon starts working 7 days after it was inserted.  There is a risk of getting pregnant if you have unprotected sex in those first 7 days after placement of the Nexplanon.  The Nexplanon lasts for 3 years but can be removed at any time.  You can become pregnant as early as 1 week after removal.  You can have a new Nexplanon put in after the old one is removed if you like.  Always tell other healthcare providers that you have a Nexplanon in your arm.  The Nexplanon was placed just under the skin.  Leave the outside bandage on for 24 hours.  Leave the smaller bandage on for 3-5 days or until it falls off on its own.  Keep the area clean and dry for 3-5 days. There is usually bruising or swelling at the insertion site for a few days to a week after placement.  If you see redness or pus draining from the insertion site, call us immediately.  Keep your user card with the date the implant was placed and the date the implant is to be removed.  The most common side effect is a change in your menstrual bleeding pattern.   This bleeding is generally not harmful to you but can be annoying.  Call or come in to see [place] if you have any concerns about the bleeding or if you have any side effects or questions.     Nexplanon Instructions After Insertion   Keep bandage clean and dry for 24 hours   May use ice/Tylenol/Ibuprofen for soreness or pain   If you develop fever, drainage or increased warmth from incision site-contact office immediately  Keeping You Healthy  Get These Tests 2. Blood Pressure- Have your blood pressure checked once a year by your health care provider.  Normal blood pressure is 120/80. 3. Weight- Have your body mass index (BMI)  calculated to screen for obesity.  BMI is measure of body fat based on height and weight.  You can also calculate your own BMI at [employer].it. 4. Cholesterol- Have your cholesterol checked every 5 years starting at age 67 then yearly starting at age 74. 58. Chlamydia, HIV, and other sexually transmitted diseases- Get screened every year until age 39, then within three months of each new sexual provider. 6. Pap Test - Every 1-5 years; discuss with your health care provider. 7. Mammogram- Every 1-2 years starting at age 52--50  Take these medicines  Calcium with Vitamin D-Your body needs 1200 mg of Calcium each day and [phone] IU of Vitamin D daily.  Your body can only absorb 500 mg of Calcium at a time so Calcium must be taken in 2 or 3 divided doses throughout the day.  Multivitamin with folic acid- Once daily if it is possible for you to become pregnant.  Get these Immunizations  Gardasil-Series of three doses; prevents HPV related illness such as genital warts and cervical cancer.  Menactra-Single dose; prevents meningitis.  Tetanus shot- Every 10 years.  Flu shot-Every year.  Take these steps 1. Do not smoke-Your healthcare provider can help you quit.  For tips on how to quit go to www.smokefree.gov or call 1-800 QUITNOW. 2. Be physically active- Exercise 5 days a week for at least 30 minutes.  If you are not already  physically active, start slow and gradually work up to 30 minutes of moderate physical activity.  Examples of moderate activity include walking briskly, dancing, swimming, bicycling, etc. 3. Breast Cancer- A self breast exam every month is important for early detection of breast cancer.  For more information and instruction on self breast exams, ask your healthcare provider or [place]. 4. Eat a healthy diet- Eat a variety of healthy foods such as fruits, vegetables, whole grains, low fat milk, low fat cheeses, yogurt, lean  meats, poultry and fish, beans, nuts, tofu, etc.  For more information go to www. Thenutritionsource.org 5. Drink alcohol in moderation- Limit alcohol intake to one drink or less per day. Never drink and drive. 6. Depression- Your emotional health is as important as your physical health.  If you're feeling down or losing interest in things you normally enjoy please talk to your healthcare provider about being screened for depression. 7. Dental visit- Brush and floss your teeth twice daily; visit your dentist twice a year. 8. Eye doctor- Get an eye exam at least every 2 years. 9. Helmet use- Always wear a helmet when riding a bicycle, motorcycle, rollerblading or skateboarding. 10. Safe sex- If you may be exposed to sexually transmitted infections, use a condom. 11. Seat belts- Seat belts can save your live; always wear one. 12. Smoke/Carbon Monoxide detectors- These detectors need to be installed on the appropriate level of your home. Replace batteries at least once a year. 13. Skin cancer- When out in the sun please cover up and use sunscreen 15 SPF or higher. 14. Violence- If anyone is threatening or hurting you, please tell your healthcare provider.

## 2019-06-04 ENCOUNTER — Other Ambulatory Visit: Payer: Self-pay

## 2019-06-04 ENCOUNTER — Other Ambulatory Visit (HOSPITAL_COMMUNITY)
Admission: RE | Admit: 2019-06-04 | Discharge: 2019-06-04 | Disposition: A | Discharge status: Home / Self Care | Payer: Commercial Managed Care - PPO | Source: Ambulatory Visit | Attending: Internal Medicine | Admitting: Internal Medicine

## 2019-06-04 ENCOUNTER — Ambulatory Visit (INDEPENDENT_AMBULATORY_CARE_PROVIDER_SITE_OTHER): Payer: Commercial Managed Care - PPO | Admitting: Internal Medicine

## 2019-06-04 ENCOUNTER — Encounter: Payer: Self-pay | Admitting: Internal Medicine

## 2019-06-04 VITALS — BP 151/101 | HR 111 | Temp 97.5°F | Resp 17 | Ht 66.0 in | Wt 169.0 lb

## 2019-06-04 DIAGNOSIS — Z124 Encounter for screening for malignant neoplasm of cervix: Secondary | ICD-10-CM | POA: Insufficient documentation

## 2019-06-04 DIAGNOSIS — Z0000 Encounter for general adult medical examination without abnormal findings: Secondary | ICD-10-CM

## 2019-06-04 DIAGNOSIS — Z30017 Encounter for initial prescription of implantable subdermal contraceptive: Secondary | ICD-10-CM

## 2019-06-04 DIAGNOSIS — J4520 Mild intermittent asthma, uncomplicated: Secondary | ICD-10-CM

## 2019-06-04 DIAGNOSIS — Z113 Encounter for screening for infections with a predominantly sexual mode of transmission: Secondary | ICD-10-CM | POA: Diagnosis not present

## 2019-06-04 DIAGNOSIS — I10 Essential (primary) hypertension: Secondary | ICD-10-CM

## 2019-06-04 DIAGNOSIS — Z975 Presence of (intrauterine) contraceptive device: Secondary | ICD-10-CM

## 2019-06-04 MED ORDER — ALBUTEROL SULFATE HFA 108 (90 BASE) MCG/ACT IN AERS: 1.0000 | INHALATION_SPRAY | Freq: Four times a day (QID) | RESPIRATORY_TRACT | 5 refills | Status: DC | PRN

## 2019-06-04 MED ORDER — ETONOGESTREL 68 MG ~~LOC~~ IMPL
68.0000 mg | DRUG_IMPLANT | Freq: Once | SUBCUTANEOUS | Status: AC
  Administered 2019-06-04: 68 mg via SUBCUTANEOUS
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
## 2019-06-05 LAB — CERVICOVAGINAL ANCILLARY ONLY
Bacterial Vaginitis (gardnerella): POSITIVE — AB
Candida Glabrata: NEGATIVE
Candida Vaginitis: NEGATIVE
Chlamydia: NEGATIVE
Comment: NEGATIVE
Comment: NEGATIVE
Comment: NEGATIVE
Comment: NEGATIVE
Comment: NEGATIVE
Comment: NORMAL
Neisseria Gonorrhea: NEGATIVE
Trichomonas: NEGATIVE

## 2019-06-05 LAB — HIV ANTIBODY (ROUTINE TESTING W REFLEX): HIV Screen 4th Generation wRfx: NONREACTIVE

## 2019-06-05 LAB — LIPID PANEL
Chol/HDL Ratio: 2.4 ratio (ref 0.0–4.4)
Cholesterol, Total: 176 mg/dL (ref 100–199)
HDL: 73 mg/dL (ref >39)
LDL Chol Calc (NIH): 89 mg/dL (ref 0–99)
Triglycerides: 76 mg/dL (ref 0–149)
VLDL Cholesterol Cal: 14 mg/dL (ref 5–40)

## 2019-06-05 LAB — RPR: RPR Ser Ql: NONREACTIVE

## 2019-06-06 ENCOUNTER — Other Ambulatory Visit: Payer: Self-pay | Admitting: Internal Medicine

## 2019-06-06 LAB — CYTOLOGY - PAP
Adequacy: ABSENT
Comment: NEGATIVE
Diagnosis: NEGATIVE
High risk HPV: NEGATIVE

## 2019-06-06 MED ORDER — METRONIDAZOLE 500 MG PO TABS: 500.0000 mg | ORAL_TABLET | Freq: Two times a day (BID) | ORAL | 0 refills | Status: DC

## 2019-06-11 NOTE — Progress Notes (Signed)
Patient notified of results & recommendations. Expressed understanding.

## 2019-07-18 ENCOUNTER — Ambulatory Visit: Payer: Commercial Managed Care - PPO | Admitting: Internal Medicine

## 2019-07-23 ENCOUNTER — Telehealth: Payer: Self-pay

## 2019-07-23 NOTE — Telephone Encounter (Signed)

## 2019-07-24 ENCOUNTER — Ambulatory Visit (INDEPENDENT_AMBULATORY_CARE_PROVIDER_SITE_OTHER): Payer: Commercial Managed Care - PPO | Admitting: Internal Medicine

## 2019-07-24 ENCOUNTER — Encounter: Payer: Self-pay | Admitting: Internal Medicine

## 2019-07-24 ENCOUNTER — Other Ambulatory Visit: Payer: Self-pay

## 2019-07-24 VITALS — BP 121/86 | HR 80 | Temp 97.3°F | Resp 17 | Wt 171.0 lb

## 2019-07-24 DIAGNOSIS — H60501 Unspecified acute noninfective otitis externa, right ear: Secondary | ICD-10-CM

## 2019-07-24 DIAGNOSIS — I10 Essential (primary) hypertension: Secondary | ICD-10-CM

## 2019-07-24 DIAGNOSIS — Z1159 Encounter for screening for other viral diseases: Secondary | ICD-10-CM

## 2019-07-24 MED ORDER — AMLODIPINE BESYLATE 10 MG PO TABS: 10.0000 mg | ORAL_TABLET | Freq: Every day | ORAL | 1 refills | Status: DC

## 2019-07-24 MED ORDER — CIPROFLOXACIN-DEXAMETHASONE 0.3-0.1 % OT SUSP: 4.0000 [drp] | Freq: Two times a day (BID) | OTIC | 0 refills | Status: DC

## 2019-07-24 NOTE — Progress Notes (Signed)
  Subjective:    Katherine Knight - 39 y.o. female MRN [id number]  Date of birth: 12/31/1980  HPI  Katherine Knight is here for follow up on HTN.  Chronic HTN Disease Monitoring:  Home BP Monitoring - 110-20s/80s  Chest pain- no  Dyspnea- no Headache - no  Medications: Amlodipine 10 mg  Compliance- yes Lightheadedness- no  Edema- no    Has concerns about right ear. Says has two weeks of ear canal feeling itchy and like she might have more liquid type wax. Muffled hearing on that side. No nasal congestion, rhinorrhea, sinus pressure, sore throat. Afebrile. No recent swimming, soaking in bathtubs or hot tubs.      Health Maintenance:  Health Maintenance Due  Topic Date Due  . Hepatitis C Screening  Never done  . COVID-19 Vaccine (1) Never done    -  reports that she has been smoking cigarettes. She has been smoking about 1.00 pack per day. She has never used smokeless tobacco. - Review of Systems: Per HPI. - Past Medical History: Patient Active Problem List   Diagnosis Date Noted  . Nexplanon in place 06/04/2019  . BREAST MASS 07/08/2008  . ESSENTIAL HYPERTENSION, BENIGN 06/23/2008  . OBESITY, NOS 03/08/2006  . TOBACCO DEPENDENCE 03/08/2006  . DEPRESSIVE DISORDER, NOS 03/08/2006  . Asthma 03/08/2006   - Medications: reviewed and updated   Objective:   Physical Exam BP 121/86   Pulse 80   Temp (!) 97.3 F (36.3 C) (Temporal)   Resp 17   Wt 171 lb (77.6 kg)   SpO2 96%   BMI 27.60 kg/m  Physical Exam Constitutional:      General: She is not in acute distress.    Appearance: She is not diaphoretic.  HENT:     Ears:     Comments: Right ear canal erythematous with flaking skin. TM is normal.  Cardiovascular:     Rate and Rhythm: Normal rate.  Pulmonary:     Effort: Pulmonary effort is normal. No respiratory distress.  Musculoskeletal:        General: Normal range of motion.  Skin:    General: Skin is warm and dry.  Neurological:      Mental Status: She is alert and oriented to person, place, and time.  Psychiatric:        Mood and Affect: Affect normal.        Judgment: Judgment normal.            Assessment & Plan:   1. Essential hypertension BP at goal. Continue current regimen. Follow up in 3 months.  - amLODipine (NORVASC) 10 MG tablet; Take 1 tablet (10 mg total) by mouth daily.  Dispense: 90 tablet; Refill: 1  2. Need for hepatitis C screening test - Hepatitis C Antibody  3. Acute otitis externa of right ear, unspecified type Appears consistent with mild otitis externa. No signs of otitis media on exam. Will treat with topical antibiotic and steroid. If not improving, return.  - ciprofloxacin-dexamethasone (CIPRODEX) OTIC suspension; Place 4 drops into the right ear 2 (two) times daily.  Dispense: 7.5 mL; Refill: 0     Marcy Siren, D.O. 07/24/2019, 11:15 AM Primary Care at [facility]

## 2019-07-25 LAB — HEPATITIS C ANTIBODY: Hep C Virus Ab: <0.1 s/co ratio (ref 0.0–0.9)

## 2019-07-29 NOTE — Progress Notes (Signed)
Patient notified of results & recommendations. Expressed understanding.

## 2019-10-30 ENCOUNTER — Telehealth (INDEPENDENT_AMBULATORY_CARE_PROVIDER_SITE_OTHER): Payer: Commercial Managed Care - PPO | Admitting: Internal Medicine

## 2019-10-30 ENCOUNTER — Encounter: Payer: Self-pay | Admitting: Internal Medicine

## 2019-10-30 DIAGNOSIS — J4520 Mild intermittent asthma, uncomplicated: Secondary | ICD-10-CM

## 2019-10-30 DIAGNOSIS — N898 Other specified noninflammatory disorders of vagina: Secondary | ICD-10-CM

## 2019-10-30 DIAGNOSIS — I10 Essential (primary) hypertension: Secondary | ICD-10-CM | POA: Diagnosis not present

## 2019-10-30 MED ORDER — ALBUTEROL SULFATE HFA 108 (90 BASE) MCG/ACT IN AERS: 1.0000 | INHALATION_SPRAY | Freq: Four times a day (QID) | RESPIRATORY_TRACT | 5 refills | Status: DC | PRN

## 2019-10-30 MED ORDER — METRONIDAZOLE 500 MG PO TABS: 500.0000 mg | ORAL_TABLET | Freq: Two times a day (BID) | ORAL | 0 refills | Status: DC

## 2019-10-30 MED ORDER — AMLODIPINE BESYLATE 10 MG PO TABS: 10.0000 mg | ORAL_TABLET | Freq: Every day | ORAL | 1 refills | Status: DC

## 2019-10-30 NOTE — Progress Notes (Signed)
Virtual Visit via Telephone Note  I connected with Katherine Knight, on 10/30/2019 at 9:39 AM by telephone due to the COVID-19 pandemic and verified that I am speaking with the correct person using two identifiers.   Consent: I discussed the limitations, risks, security and privacy concerns of performing an evaluation and management service by telephone and the availability of in person appointments. I also discussed with the patient that there may be a patient responsible charge related to this service. The patient expressed understanding and agreed to proceed.   Location of Patient: Home   Location of Provider: Home    Persons participating in Telemedicine visit: [facility] Dr. Earlene Plater      History of Present Illness: Patient has a visit to follow up on chronic medical conditions.   Chronic HTN Disease Monitoring:  Home BP Monitoring - Does not monitor frequently. Chest pain- no  Dyspnea- no Headache - no  Medications: Amlodipine 10 mg  Compliance- no, has been without for 2 weeks due to change in pharmacies   Lightheadedness- no  Edema- no   No concerns about breathing. Hasn't had to use it the past month. Has very occasional wheeze that will prompt her to use it.    Past Medical History:  Diagnosis Date  . Asthma   . Depression   . Hypertension    No Known Allergies  Current Outpatient Medications on File Prior to Visit  Medication Sig Dispense Refill  . albuterol (VENTOLIN HFA) 108 (90 Base) MCG/ACT inhaler Inhale 1-2 puffs into the lungs every 6 (six) hours as needed for wheezing or shortness of breath. (Patient not taking: Reported on 10/30/2019) 18 g 5  . amLODipine (NORVASC) 10 MG tablet Take 1 tablet (10 mg total) by mouth daily. (Patient not taking: Reported on 10/30/2019) 90 tablet 1   No current facility-administered medications on file prior to visit.    Observations/Objective: NAD. Speaking clearly.   Work of breathing normal.  Alert and oriented. Mood appropriate.   Assessment and Plan: 1. Essential hypertension Not monitoring. Asymptomatic. Continue current regimen.  - amLODipine (NORVASC) 10 MG tablet; Take 1 tablet (10 mg total) by mouth daily.  Dispense: 90 tablet; Refill: 1  2. Mild intermittent asthma without complication Well controlled.  - albuterol (VENTOLIN HFA) 108 (90 Base) MCG/ACT inhaler; Inhale 1-2 puffs into the lungs every 6 (six) hours as needed for wheezing or shortness of breath.  Dispense: 18 g; Refill: 5  3. Vaginal odor Patient endorses lots of irregular and continuous vaginal bleeding after insertion of Nexplanon in May. After having prolonged bleeding for a month this summer, has noticed an odor that seems similar to that of her period even through she isnt bleeding. Most suspicious of BV related to menstrual cycles as etiology. Treat empirically with Flagyl. If not improvement, return.  - metroNIDAZOLE (FLAGYL) 500 MG tablet; Take 1 tablet (500 mg total) by mouth 2 (two) times daily.  Dispense: 14 tablet; Refill: 0   Follow Up Instructions: PRN and for routine medical care    I discussed the assessment and treatment plan with the patient. The patient was provided an opportunity to ask questions and all were answered. The patient agreed with the plan and demonstrated an understanding of the instructions.   The patient was advised to call back or seek an in-person evaluation if the symptoms worsen or if the condition fails to improve as anticipated.     I provided 20 minutes total of non-face-to-face  time during this encounter including median intraservice time, reviewing previous notes, investigations, ordering medications, medical decision making, coordinating care and patient verbalized understanding at the end of the visit.    Marcy Siren, D.O. Primary Care at [facility]  10/30/2019, 9:39 AM

## 2019-11-24 ENCOUNTER — Other Ambulatory Visit: Payer: Self-pay

## 2019-11-24 ENCOUNTER — Ambulatory Visit (INDEPENDENT_AMBULATORY_CARE_PROVIDER_SITE_OTHER): Payer: Commercial Managed Care - PPO | Admitting: Family Medicine

## 2019-11-24 VITALS — BP 144/97 | HR 81 | Temp 97.5°F | Resp 17 | Wt 173.0 lb

## 2019-11-24 DIAGNOSIS — Z716 Tobacco abuse counseling: Secondary | ICD-10-CM | POA: Diagnosis not present

## 2019-11-24 DIAGNOSIS — I83813 Varicose veins of bilateral lower extremities with pain: Secondary | ICD-10-CM | POA: Diagnosis not present

## 2019-11-24 MED ORDER — VARENICLINE TARTRATE 1 MG PO TABS: 1.0000 mg | ORAL_TABLET | Freq: Two times a day (BID) | ORAL | 0 refills | Status: DC

## 2019-11-24 MED ORDER — BUPROPION HCL ER (SR) 150 MG PO TB12: 150.0000 mg | ORAL_TABLET | Freq: Two times a day (BID) | ORAL | 0 refills | Status: DC

## 2019-11-24 MED ORDER — VARENICLINE TARTRATE 0.5 MG PO TABS: ORAL_TABLET | ORAL | 0 refills | Status: DC

## 2019-11-24 NOTE — Progress Notes (Signed)
Patient ID: Katherine Knight, female    DOB: 1980-07-14, 39 y.o.   MRN: [id number]  PCP: Arvilla Market, DO  Chief Complaint  Patient presents with   Varicose Veins    Subjective:  HPI  Katherine Knight is a 39 y.o. female, presents for evaluation of varicose veins which are causing pain on the lower left leg and smoking cessation counseling.  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 She became concern as she has experienced localized pain upper anterior leg below the knee and subsequently noticed that her vein is bulging, tender and has an irregular pattern. She has prominent irregularly shaped venous patterns on her left leg as well, however are not painful. She has not attempted use of compression socks. She has not injured her let and is not experiencing any acute calf pain or swelling.  Smoking cessation Wishes to stop smoking. No prior use of anti cessation medications. Denies any psychiatric history of use of psychotropic medications.  Social History   Socioeconomic History   Marital status: Divorced    Spouse name: Not on file   Number of children: Not on file   Years of education: Not on file   Highest education level: Not on file  Occupational History   Not on file  Tobacco Use   Smoking status: Current Every Day Smoker    Packs/day: 1.00    Types: Cigarettes   Smokeless tobacco: Never Used  Substance and Sexual Activity   Alcohol use: Yes   Drug use: No   Sexual activity: Not on file  Other Topics Concern   Not on file  Social History Narrative   Not on file   Social Determinants of Health   Financial Resource Strain:    Difficulty of Paying Living Expenses: Not on file  Food Insecurity:    Worried About Running Out of Food in the Last Year: Not on file   Ran Out of Food in the Last Year: Not on file  Transportation Needs:    Lack of Transportation (Medical): Not on file    Lack of Transportation (Non-Medical): Not on file  Physical Activity:    Days of Exercise per Week: Not on file   Minutes of Exercise per Session: Not on file  Stress:    Feeling of Stress : Not on file  Social Connections:    Frequency of Communication with Friends and Family: Not on file   Frequency of Social Gatherings with Friends and Family: Not on file   Attends Religious Services: Not on file   Active Member of Clubs or Organizations: Not on file   Attends Banker Meetings: Not on file   Marital Status: Not on file  Intimate Partner Violence:    Fear of Current or Ex-Partner: Not on file   Emotionally Abused: Not on file   Physically Abused: Not on file   Sexually Abused: Not on file    No family history on file.   Review of Systems Pertinent negatives listed in HPI  No Known Allergies  Prior to Admission medications   Medication Sig Start Date End Date Taking? Authorizing Provider  albuterol (VENTOLIN HFA) 108 (90 Base) MCG/ACT inhaler Inhale 1-2 puffs into the lungs every 6 (six) hours as needed for wheezing or shortness of breath. 10/30/19   Arvilla Market, DO  amLODipine (NORVASC) 10 MG tablet Take 1 tablet (10 mg total) by mouth daily. 10/30/19  Arvilla Market, DO  [facility] SR) 150 MG 12 hr tablet Take 1 tablet (150 mg total) by mouth 2 (two) times daily. 11/24/19   Bing Neighbors, FNP    Past Medical, Surgical Family and Social History reviewed and updated.    Objective:   Today's Vitals   11/24/19 1119  BP: (!) 144/97  Pulse: 81  Resp: 17  Temp: (!) 97.5 F (36.4 C)  TempSrc: Temporal  SpO2: 98%  Weight: 173 lb (78.5 kg)    BP Readings from Last 3 Encounters:  11/24/19 (!) 144/97  07/24/19 121/86  06/04/19 (!) 151/101    Filed Weights   11/24/19 1119  Weight: 173 lb (78.5 kg)       Physical Exam General appearance: alert, well developed, well nourished, cooperative and in  no distress Head: Normocephalic, without obvious abnormality, atraumatic Respiratory: Respirations even and unlabored, normal respiratory rate Heart: rate and rhythm normal. No gallop or murmurs noted on exam  Extremities: prominent varicosities bilateral lower extremities (anteriorly only)Skin: Skin color, texture, turgor normal. No rashes seen  Psych: Appropriate mood and affect. Neurologic: Mental status: Alert, oriented to person, place, and time, thought content appropriate.   Assessment & Plan:  1. Varicose veins of both lower extremities with pain Trial Compression wear, if no improvement order placed for referral to vein specialist  - Ambulatory referral to Vascular Surgery  2. Encounter for smoking cessation counseling -Trial Bupropion SR 150 mg BID , recommend for period of 90 day -Encouraged to choose a quit date and stick to it. -Written education provided with discharge summary -Discussed side effects of medication and advised d/c and follow-up if any adverse symptoms develop -Patient is motivated to quit     Joaquin Courts, FNP Primary Care at [facility] [address] [place] [address] [phone] fax: [phone]

## 2019-11-24 NOTE — Patient Instructions (Addendum)
Wellbutrin SR (bupropion) is prescribed as 150 mg twice daily for 90 days.  Choose a quit date then start medication.  A referral was placed for vein and vascular. Wear compression stockings if no improvement, schedule an appointment.       Varicose Veins Varicose veins are veins that have become enlarged, bulged, and twisted. They most often appear in the legs. What are the causes? This condition is caused by damage to the valves in the vein. These valves help blood return to your heart. When they are damaged and they stop working properly, blood may flow backward and back up in the veins near the skin, causing the veins to get larger and appear twisted. The condition can result from any issue that causes blood to back up, like pregnancy, prolonged standing, or obesity. What increases the risk? This condition is more likely to develop in people who are:  On their feet a lot.  Pregnant.  Overweight. What are the signs or symptoms? Symptoms of this condition include:  Bulging, twisted, and bluish veins.  A feeling of heaviness. This may be worse at the end of the day.  Leg pain. This may be worse at the end of the day.  Swelling in the leg.  Changes in skin color over the veins. How is this diagnosed? This condition may be diagnosed based on your symptoms, a physical exam, and an ultrasound test. How is this treated? Treatment for this condition may involve:  Avoiding sitting or standing in one position for long periods of time.  Wearing compression stockings. These stockings help to prevent blood clots and reduce swelling in the legs.  Raising (elevating) the legs when resting.  Losing weight.  Exercising regularly. If you have persistent symptoms or want to improve the way your varicose veins look, you may choose to have a procedure to close the varicose veins off or to remove them. Treatments to close off the veins include:  Sclerotherapy. In this treatment, a  solution is injected into a vein to close it off.  Laser treatment. In this treatment, the vein is heated with a laser to close it off.  Radiofrequency vein ablation. In this treatment, an electrical current produced by radio waves is used to close off the vein. Treatments to remove the veins include:  Phlebectomy. In this treatment, the veins are removed through small incisions made over the veins.  Vein ligation and stripping. In this treatment, incisions are made over the veins. The veins are then removed after being tied (ligated) with stitches (sutures). Follow these instructions at home: Activity  Walk as much as possible. Walking increases blood flow. This helps blood return to the heart and takes pressure off your veins. It also increases your cardiovascular strength.  Follow your health care provider's instructions about exercising.  Do not stand or sit in one position for a long period of time.  Do not sit with your legs crossed.  Rest with your legs raised during the day. General instructions   Follow any diet instructions given to you by your health care provider.  Wear compression stockings as directed by your health care provider. Do not wear other kinds of tight clothing around your legs, pelvis, or waist.  Elevate your legs at night to above the level of your heart.  If you get a cut in the skin over the varicose vein and the vein bleeds: ? Lie down with your leg raised. ? Apply firm pressure to the cut with a  clean cloth until the bleeding stops. ? Place a bandage (dressing) on the cut. Contact a health care provider if:  The skin around your varicose veins starts to break down.  You have pain, redness, tenderness, or hard swelling over a vein.  You are uncomfortable because of pain.  You get a cut in the skin over a varicose vein and it will not stop bleeding. Summary  Varicose veins are veins that have become enlarged, bulged, and twisted. They most  often appear in the legs.  This condition is caused by damage to the valves in the vein. These valves help blood return to your heart.  Treatment for this condition includes frequent movements, wearing compression stockings, losing weight, and exercising regularly. In some cases, procedures are done to close off or remove the veins.  Treatment for this condition may include wearing compression stockings, elevating the legs, losing weight, and engaging in regular activity. In some cases, procedures are done to close off or remove the veins. This information is not intended to replace advice given to you by your health care provider. Make sure you discuss any questions you have with your health care provider. Document Revised: 02/21/2018 Document Reviewed: 01/19/2016 Elsevier Patient Education  2020 [employer].  Coping with Quitting Smoking  Quitting smoking is a physical and mental challenge. You will face cravings, withdrawal symptoms, and temptation. Before quitting, work with your health care provider to make a plan that can help you cope. Preparation can help you quit and keep you from giving in. How can I cope with cravings? Cravings usually last for 5-10 minutes. If you get through it, the craving will pass. Consider taking the following actions to help you cope with cravings:  Keep your mouth busy: ? Chew sugar-free gum. ? Suck on hard candies or a straw. ? Brush your teeth.  Keep your hands and body busy: ? Immediately change to a different activity when you feel a craving. ? Squeeze or play with a ball. ? Do an activity or a hobby, like making bead jewelry, practicing needlepoint, or working with wood. ? Mix up your normal routine. ? Take a short exercise break. Go for a quick walk or run up and down stairs. ? Spend time in public places where smoking is not allowed.  Focus on doing something kind or helpful for someone else.  Call a friend or family member to talk during a  craving.  Join a support group.  Call a quit line, such as 1-800-QUIT-NOW.  Talk with your health care provider about medicines that might help you cope with cravings and make quitting easier for you. How can I deal with withdrawal symptoms? Your body may experience negative effects as it tries to get used to not having nicotine in the system. These effects are called withdrawal symptoms. They may include:  Feeling hungrier than normal.  Trouble concentrating.  Irritability.  Trouble sleeping.  Feeling depressed.  Restlessness and agitation.  Craving a cigarette. To manage withdrawal symptoms:  Avoid places, people, and activities that trigger your cravings.  Remember why you want to quit.  Get plenty of sleep.  Avoid coffee and other caffeinated drinks. These may worsen some of your symptoms. How can I handle social situations? Social situations can be difficult when you are quitting smoking, especially in the first few weeks. To manage this, you can:  Avoid parties, bars, and other social situations where people might be smoking.  Avoid alcohol.  Leave right away if  you have the urge to smoke.  Explain to your family and friends that you are quitting smoking. Ask for understanding and support.  Plan activities with friends or family where smoking is not an option. What are some ways I can cope with stress? Wanting to smoke may cause stress, and stress can make you want to smoke. Find ways to manage your stress. Relaxation techniques can help. For example:  Breathe slowly and deeply, in through your nose and out through your mouth.  Listen to soothing, relaxing music.  Talk with a family member or friend about your stress.  Light a candle.  Soak in a bath or take a shower.  Think about a peaceful place. What are some ways I can prevent weight gain? Be aware that many people gain weight after they quit smoking. However, not everyone does. To keep from  gaining weight, have a plan in place before you quit and stick to the plan after you quit. Your plan should include:  Having healthy snacks. When you have a craving, it may help to: ? Eat plain popcorn, crunchy carrots, celery, or other cut vegetables. ? Chew sugar-free gum.  Changing how you eat: ? Eat small portion sizes at meals. ? Eat 4-6 small meals throughout the day instead of 1-2 large meals a day. ? Be mindful when you eat. Do not watch television or do other things that might distract you as you eat.  Exercising regularly: ? Make time to exercise each day. If you do not have time for a long workout, do short bouts of exercise for 5-10 minutes several times a day. ? Do some form of strengthening exercise, like weight lifting, and some form of aerobic exercise, like running or swimming.  Drinking plenty of water or other low-calorie or no-calorie drinks. Drink 6-8 glasses of water daily, or as much as instructed by your health care provider. Summary  Quitting smoking is a physical and mental challenge. You will face cravings, withdrawal symptoms, and temptation to smoke again. Preparation can help you as you go through these challenges.  You can cope with cravings by keeping your mouth busy (such as by chewing gum), keeping your body and hands busy, and making calls to family, friends, or a helpline for people who want to quit smoking.  You can cope with withdrawal symptoms by avoiding places where people smoke, avoiding drinks with caffeine, and getting plenty of rest.  Ask your health care provider about the different ways to prevent weight gain, avoid stress, and handle social situations. This information is not intended to replace advice given to you by your health care provider. Make sure you discuss any questions you have with your health care provider. Document Revised: 12/08/2016 Document Reviewed: 12/24/2015 Elsevier Patient Education  2020 [employer].

## 2019-12-02 ENCOUNTER — Ambulatory Visit
Admission: EM | Admit: 2019-12-02 | Discharge: 2019-12-02 | Disposition: A | Discharge status: Home / Self Care | Payer: Commercial Managed Care - PPO | Attending: Emergency Medicine | Admitting: Emergency Medicine

## 2019-12-02 ENCOUNTER — Other Ambulatory Visit: Payer: Self-pay

## 2019-12-02 DIAGNOSIS — N760 Acute vaginitis: Secondary | ICD-10-CM | POA: Insufficient documentation

## 2019-12-02 LAB — POCT URINALYSIS DIP (MANUAL ENTRY)
Bilirubin, UA: NEGATIVE
Glucose, UA: NEGATIVE mg/dL
Ketones, POC UA: NEGATIVE mg/dL
Nitrite, UA: NEGATIVE
Protein Ur, POC: NEGATIVE mg/dL
Spec Grav, UA: 1.025 (ref 1.010–1.025)
Urobilinogen, UA: 0.2 E.U./dL
pH, UA: 7 (ref 5.0–8.0)

## 2019-12-02 LAB — POCT URINE PREGNANCY: Preg Test, Ur: NEGATIVE

## 2019-12-02 MED ORDER — FLUCONAZOLE 150 MG PO TABS: ORAL_TABLET | ORAL | 0 refills | Status: DC

## 2019-12-02 NOTE — ED Triage Notes (Signed)
Pt states she has had a cottage cheese type vaginal discharge x 2 days. Pt states she also is experiencing burning/stinging while urinating. Pt is aox4 and ambulatory.

## 2019-12-02 NOTE — Discharge Instructions (Addendum)
I have sent medications to take to treat for yeast, which I feel will help with your symptoms.  Your vaginal swab is testing to confirm this as well as test for BV, gonorrhea, chlamydia and trichomonas.  We will notify of you any positive findings or if any changes to treatment are needed. If normal or otherwise without concern to your results, we will not call you. Please log on to your MyChart to review your results if interested in so.

## 2019-12-02 NOTE — ED Provider Notes (Signed)
[facility]    CSN: [id number] Arrival date & time: 12/02/19  1018      History   Chief Complaint Chief Complaint  Patient presents with  . Vaginal Discharge    x 2 days    HPI Katherine Knight is a 39 y.o. female.   Katherine Knight presents with complaints of vaginal irritation and itching. Describes Chunky white vaginal discharge. Burning to vulva/ vagina. Three weeks ago was treated with flagyl by her PCP. Has a new partner however, so there is concern for potential std, although no specific known exposure. No urinary symptoms. No pelvic pain.     ROS per HPI, negative if not otherwise mentioned.      Past Medical History:  Diagnosis Date  . Asthma   . Depression   . Hypertension     Patient Active Problem List   Diagnosis Date Noted  . Nexplanon in place 06/04/2019  . BREAST MASS 07/08/2008  . ESSENTIAL HYPERTENSION, BENIGN 06/23/2008  . OBESITY, NOS 03/08/2006  . TOBACCO DEPENDENCE 03/08/2006  . DEPRESSIVE DISORDER, NOS 03/08/2006  . Asthma 03/08/2006    History reviewed. No pertinent surgical history.  OB History   No obstetric history on file.      Home Medications    Prior to Admission medications   Medication Sig Start Date End Date Taking? Authorizing Provider  amLODipine (NORVASC) 10 MG tablet Take 1 tablet (10 mg total) by mouth daily. 10/30/19  Yes Arvilla Market, DO  albuterol (VENTOLIN HFA) 108 (90 Base) MCG/ACT inhaler Inhale 1-2 puffs into the lungs every 6 (six) hours as needed for wheezing or shortness of breath. 10/30/19   Arvilla Market, DO  buPROPion (WELLBUTRIN SR) 150 MG 12 hr tablet Take 1 tablet (150 mg total) by mouth 2 (two) times daily. 11/24/19   Bing Neighbors, FNP  fluconazole (DIFLUCAN) 150 MG tablet Take 1 tab today. May repeat in 72 hours if symptoms have not completely resolved. 12/02/19   Georgetta Haber, NP    Family History History reviewed. No pertinent family  history.  Social History Social History   Tobacco Use  . Smoking status: Current Every Day Smoker    Packs/day: 1.00    Types: Cigarettes  . Smokeless tobacco: Never Used  Vaping Use  . Vaping Use: Never used  Substance Use Topics  . Alcohol use: Yes  . Drug use: No     Allergies   Patient has no known allergies.   Review of Systems Review of Systems   Physical Exam Triage Vital Signs ED Triage Vitals  Enc Vitals Group     BP 12/02/19 1034 130/88     Pulse Rate 12/02/19 1034 88     Resp 12/02/19 1034 18     Temp 12/02/19 1034 98.4 F (36.9 C)     Temp src --      SpO2 12/02/19 1034 100 %     Weight --      Height --      Head Circumference --      Peak Flow --      Pain Score 12/02/19 1035 4     Pain Loc --      Pain Edu? --      Excl. in GC? --    No data found.  Updated Vital Signs BP 130/88 (BP Location: Right Arm)   Pulse 88   Temp 98.4 F (36.9 C)   Resp 18   SpO2  100%   Visual Acuity Right Eye Distance:   Left Eye Distance:   Bilateral Distance:    Right Eye Near:   Left Eye Near:    Bilateral Near:     Physical Exam Constitutional:      General: She is not in acute distress.    Appearance: She is well-developed.  Cardiovascular:     Rate and Rhythm: Normal rate.  Pulmonary:     Effort: Pulmonary effort is normal.  Abdominal:     Palpations: Abdomen is not rigid.     Tenderness: There is no abdominal tenderness. There is no guarding or rebound.  Genitourinary:    Comments: Denies sores, lesions, vaginal bleeding; no pelvic pain; gu exam deferred at this time, vaginal self swab collected.   Skin:    General: Skin is warm and dry.  Neurological:     Mental Status: She is alert and oriented to person, place, and time.      UC Treatments / Results  Labs (all labs ordered are listed, but only abnormal results are displayed) Labs Reviewed  POCT URINALYSIS DIP (MANUAL ENTRY)  POCT URINE PREGNANCY  CERVICOVAGINAL ANCILLARY ONLY     EKG   Radiology No results found.  Procedures Procedures (including critical care time)  Medications Ordered in UC Medications - No data to display  Initial Impression / Assessment and Plan / UC Course  I have reviewed the triage vital signs and the nursing notes.  Pertinent labs & imaging results that were available during my care of the patient were reviewed by me and considered in my medical decision making (see chart for details).     Diflucan provided at this time, pending vaginal cytology. Return precautions provided. Patient verbalized understanding and agreeable to plan.   Final Clinical Impressions(s) / UC Diagnoses   Final diagnoses:  Acute vaginitis     Discharge Instructions     I have sent medications to take to treat for yeast, which I feel will help with your symptoms.  Your vaginal swab is testing to confirm this as well as test for BV, gonorrhea, chlamydia and trichomonas.  We will notify of you any positive findings or if any changes to treatment are needed. If normal or otherwise without concern to your results, we will not call you. Please log on to your MyChart to review your results if interested in so.      ED Prescriptions    Medication Sig Dispense Auth. Provider   fluconazole (DIFLUCAN) 150 MG tablet Take 1 tab today. May repeat in 72 hours if symptoms have not completely resolved. 2 tablet Georgetta Haber, NP     PDMP not reviewed this encounter.   Georgetta Haber, NP 12/02/19 1249

## 2019-12-03 LAB — CERVICOVAGINAL ANCILLARY ONLY
Bacterial Vaginitis (gardnerella): NEGATIVE
Candida Glabrata: NEGATIVE
Candida Vaginitis: POSITIVE — AB
Chlamydia: NEGATIVE
Comment: NEGATIVE
Comment: NEGATIVE
Comment: NEGATIVE
Comment: NEGATIVE
Comment: NEGATIVE
Comment: NORMAL
Neisseria Gonorrhea: NEGATIVE
Trichomonas: NEGATIVE

## 2020-02-02 ENCOUNTER — Encounter (INDEPENDENT_AMBULATORY_CARE_PROVIDER_SITE_OTHER): Payer: Self-pay | Admitting: Vascular Surgery

## 2020-02-04 ENCOUNTER — Encounter (INDEPENDENT_AMBULATORY_CARE_PROVIDER_SITE_OTHER): Payer: Self-pay | Admitting: Nurse Practitioner

## 2020-05-11 ENCOUNTER — Other Ambulatory Visit: Payer: Self-pay | Admitting: Internal Medicine

## 2020-05-11 DIAGNOSIS — I10 Essential (primary) hypertension: Secondary | ICD-10-CM

## 2020-05-31 ENCOUNTER — Encounter: Payer: Self-pay | Admitting: Internal Medicine

## 2020-05-31 ENCOUNTER — Other Ambulatory Visit: Payer: Self-pay

## 2020-05-31 ENCOUNTER — Ambulatory Visit (INDEPENDENT_AMBULATORY_CARE_PROVIDER_SITE_OTHER): Payer: BC Managed Care – PPO | Admitting: Internal Medicine

## 2020-05-31 VITALS — BP 151/113 | HR 92 | Temp 97.3°F | Resp 16 | Wt 185.0 lb

## 2020-05-31 DIAGNOSIS — I10 Essential (primary) hypertension: Secondary | ICD-10-CM

## 2020-05-31 DIAGNOSIS — F17200 Nicotine dependence, unspecified, uncomplicated: Secondary | ICD-10-CM | POA: Diagnosis not present

## 2020-05-31 DIAGNOSIS — J4520 Mild intermittent asthma, uncomplicated: Secondary | ICD-10-CM

## 2020-05-31 MED ORDER — BUPROPION HCL ER (SR) 150 MG PO TB12: 150.0000 mg | ORAL_TABLET | Freq: Two times a day (BID) | ORAL | 0 refills | Status: DC

## 2020-05-31 MED ORDER — ALBUTEROL SULFATE HFA 108 (90 BASE) MCG/ACT IN AERS
1.0000 | INHALATION_SPRAY | Freq: Four times a day (QID) | RESPIRATORY_TRACT | 5 refills | Status: DC | PRN
Start: 2020-05-31 — End: 2021-08-24

## 2020-05-31 MED ORDER — AMLODIPINE BESYLATE 10 MG PO TABS: 1.0000 | ORAL_TABLET | Freq: Every day | ORAL | 1 refills | Status: DC

## 2020-05-31 NOTE — Progress Notes (Signed)
Medication RF - off amlodipine since beginning of May  Noticed swelling in feet

## 2020-05-31 NOTE — Progress Notes (Signed)
  Subjective:    Katherine Knight - 40 y.o. female MRN [id number]  Date of birth: Feb 01, 1980  HPI  Katherine Knight is here for follow up.  Chronic HTN Disease Monitoring:  Home BP Monitoring - Not monitoring  Chest pain- no  Dyspnea- no Headache - no  Medications: Amlodipine 10 mg  Compliance- no, she ran out of medication   Lightheadedness- no  Edema- yes---reports has noticed it since being off of the medication    Health Maintenance:  Health Maintenance Due  Topic Date Due  . COVID-19 Vaccine (3 - Booster for Pfizer series) 10/23/2019    -  reports that she has been smoking cigarettes. She has been smoking about 1.00 pack per day. She has never used smokeless tobacco. - Review of Systems: Per HPI. - Past Medical History: Patient Active Problem List   Diagnosis Date Noted  . Nexplanon in place 06/04/2019  . BREAST MASS 07/08/2008  . ESSENTIAL HYPERTENSION, BENIGN 06/23/2008  . OBESITY, NOS 03/08/2006  . TOBACCO DEPENDENCE 03/08/2006  . DEPRESSIVE DISORDER, NOS 03/08/2006  . Asthma 03/08/2006   - Medications: reviewed and updated   Objective:   Physical Exam BP (!) 151/113 (BP Location: Left Arm, Patient Position: Sitting, Cuff Size: Large)   Pulse 92   Temp (!) 97.3 F (36.3 C)   Resp 16   Wt 185 lb (83.9 kg)   LMP 04/28/2020 (Approximate)   SpO2 97%   BMI 29.86 kg/m  Physical Exam Constitutional:      General: She is not in acute distress.    Appearance: She is not diaphoretic.  Cardiovascular:     Rate and Rhythm: Normal rate.  Pulmonary:     Effort: Pulmonary effort is normal. No respiratory distress.  Musculoskeletal:        General: Normal range of motion.  Skin:    General: Skin is warm and dry.  Neurological:     Mental Status: She is alert and oriented to person, place, and time.  Psychiatric:        Mood and Affect: Affect normal.        Judgment: Judgment normal.            Assessment & Plan:   1. Essential  hypertension BP slightly above goal, likely due to being out of medication. Will resume Amlodipine. If edema persists once back on medication, discussed can be a side effect of the drug, and we could switch to another medication such as HCTZ.  - amLODipine (NORVASC) 10 MG tablet; Take 1 tablet (10 mg total) by mouth daily.  Dispense: 90 tablet; Refill: 1  2. Mild intermittent asthma without complication - albuterol (VENTOLIN HFA) 108 (90 Base) MCG/ACT inhaler; Inhale 1-2 puffs into the lungs every 6 (six) hours as needed for wheezing or shortness of breath.  Dispense: 18 g; Refill: 5  3. TOBACCO DEPENDENCE Patient currently smokes 1 ppd. Spent 3 minutes counseling on dangers of tobacco use and benefits of quitting, offered pharmacological intervention to aid quitting and patient is ready to quit. Therapy started: Wellbutrin. This was prescribed in Nov but patient never picked it up---she is ready to try to quit today.  - buPROPion (WELLBUTRIN SR) 150 MG 12 hr tablet; Take 1 tablet (150 mg total) by mouth 2 (two) times daily.  Dispense: 180 tablet; Refill: 0    Marcy Siren, D.O. 05/31/2020, 10:05 AM Primary Care at [facility]

## 2020-09-16 ENCOUNTER — Ambulatory Visit: Payer: BC Managed Care – PPO | Admitting: Family Medicine

## 2020-10-05 ENCOUNTER — Ambulatory Visit: Payer: BC Managed Care – PPO | Admitting: Family Medicine

## 2021-08-17 ENCOUNTER — Ambulatory Visit (INDEPENDENT_AMBULATORY_CARE_PROVIDER_SITE_OTHER): Payer: 59 | Admitting: Family Medicine

## 2021-08-17 ENCOUNTER — Encounter: Payer: Self-pay | Admitting: Family Medicine

## 2021-08-17 VITALS — BP 161/119 | HR 83 | Temp 97.7°F | Resp 16 | Ht 63.0 in | Wt 184.6 lb

## 2021-08-17 DIAGNOSIS — F17200 Nicotine dependence, unspecified, uncomplicated: Secondary | ICD-10-CM

## 2021-08-17 DIAGNOSIS — Z130 Encounter for screening for diseases of the blood and blood-forming organs and certain disorders involving the immune mechanism: Secondary | ICD-10-CM

## 2021-08-17 DIAGNOSIS — F17210 Nicotine dependence, cigarettes, uncomplicated: Secondary | ICD-10-CM | POA: Diagnosis not present

## 2021-08-17 DIAGNOSIS — Z0000 Encounter for general adult medical examination without abnormal findings: Secondary | ICD-10-CM

## 2021-08-17 DIAGNOSIS — Z0001 Encounter for general adult medical examination with abnormal findings: Secondary | ICD-10-CM | POA: Diagnosis not present

## 2021-08-17 DIAGNOSIS — I10 Essential (primary) hypertension: Secondary | ICD-10-CM | POA: Diagnosis not present

## 2021-08-17 DIAGNOSIS — Z13220 Encounter for screening for lipoid disorders: Secondary | ICD-10-CM

## 2021-08-17 MED ORDER — LOSARTAN POTASSIUM 50 MG PO TABS: 50.0000 mg | ORAL_TABLET | Freq: Every day | ORAL | 0 refills | Status: DC

## 2021-08-17 NOTE — Progress Notes (Signed)
Established Patient Office Visit  Subjective    Patient ID: Katherine Knight, female    DOB: July 14, 1980  Age: 41 y.o. MRN: [id number]  CC:  Chief Complaint  Patient presents with   Annual Exam    HPI Katherine Knight presents for routine annual exam. Patient denies acute complaints or concerns.    Outpatient Encounter Medications as of 08/17/2021  Medication Sig   losartan (COZAAR) 50 MG tablet Take 1 tablet (50 mg total) by mouth daily.   albuterol (VENTOLIN HFA) 108 (90 Base) MCG/ACT inhaler Inhale 1-2 puffs into the lungs every 6 (six) hours as needed for wheezing or shortness of breath. (Patient not taking: Reported on 08/17/2021)   amLODipine (NORVASC) 10 MG tablet Take 1 tablet (10 mg total) by mouth daily. (Patient not taking: Reported on 08/17/2021)   buPROPion (WELLBUTRIN SR) 150 MG 12 hr tablet Take 1 tablet (150 mg total) by mouth 2 (two) times daily. (Patient not taking: Reported on 08/17/2021)   No facility-administered encounter medications on file as of 08/17/2021.    Past Medical History:  Diagnosis Date   Asthma    Depression    Hypertension     No past surgical history on file.  No family history on file.  Social History   Socioeconomic History   Marital status: Divorced    Spouse name: Not on file   Number of children: Not on file   Years of education: Not on file   Highest education level: Not on file  Occupational History   Not on file  Tobacco Use   Smoking status: Every Day    Packs/day: 1.00    Years: 15.00    Total pack years: 15.00    Types: Cigarettes   Smokeless tobacco: Never  Vaping Use   Vaping Use: Never used  Substance and Sexual Activity   Alcohol use: Yes   Drug use: No   Sexual activity: Yes    Birth control/protection: Implant  Other Topics Concern   Not on file  Social History Narrative   Not on file   Social Determinants of Health   Financial Resource Strain: Not on file  Food Insecurity: Not on file  Transportation  Needs: Not on file  Physical Activity: Not on file  Stress: Not on file  Social Connections: Not on file  Intimate Partner Violence: Not on file    Review of Systems  All other systems reviewed and are negative.       Objective    BP (!) 161/119   Pulse 83   Temp 97.7 F (36.5 C) (Oral)   Resp 16   Ht _0  (1.6 m)   Wt 184 lb 9.6 oz (83.7 kg)   SpO2 97%   BMI 32.70 kg/m   Physical Exam Vitals and nursing note reviewed.  Constitutional:      General: She is not in acute distress. HENT:     Head: Normocephalic and atraumatic.     Right Ear: Tympanic membrane, ear canal and external ear normal.     Left Ear: Tympanic membrane, ear canal and external ear normal.     Nose: Nose normal.     Mouth/Throat:     Mouth: Mucous membranes are moist.     Pharynx: Oropharynx is clear.  Eyes:     Conjunctiva/sclera: Conjunctivae normal.     Pupils: Pupils are equal, round, and reactive to light.  Neck:     Thyroid: No thyromegaly.  Cardiovascular:  Rate and Rhythm: Normal rate and regular rhythm.     Heart sounds: Normal heart sounds. No murmur heard. Pulmonary:     Effort: Pulmonary effort is normal. No respiratory distress.     Breath sounds: Normal breath sounds.  Abdominal:     General: There is no distension.     Palpations: Abdomen is soft. There is no mass.     Tenderness: There is no abdominal tenderness.  Musculoskeletal:        General: Normal range of motion.     Cervical back: Normal range of motion and neck supple.  Skin:    General: Skin is warm and dry.  Neurological:     General: No focal deficit present.     Mental Status: She is alert and oriented to person, place, and time.  Psychiatric:        Mood and Affect: Mood normal.        Behavior: Behavior normal.         Assessment & Plan:   1. Annual physical exam  - CMP14+EGFR  2. Uncontrolled hypertension Will start lisinopril 10 mg daily and monitor  3. TOBACCO DEPENDENCE Discussed  cessation/reduction  4. Screening for deficiency anemia  - CBC with Differential  5. Screening for lipid disorders  - Lipid Panel  6. Screening for endocrine/metabolic/immunity disorders  - TSH - Vitamin D, 25-hydroxy - Hemoglobin A1c    No follow-ups on file.   Becky Sax, MD

## 2021-08-18 LAB — HEMOGLOBIN A1C
Est. average glucose Bld gHb Est-mCnc: 100 mg/dL
Hgb A1c MFr Bld: 5.1 % (ref 4.8–5.6)

## 2021-08-18 LAB — CBC WITH DIFFERENTIAL/PLATELET
Basophils Absolute: 0 10*3/uL (ref 0.0–0.2)
Basos: 1 %
EOS (ABSOLUTE): 0.2 10*3/uL (ref 0.0–0.4)
Eos: 3 %
Hematocrit: 43.2 % (ref 34.0–46.6)
Hemoglobin: 14.9 g/dL (ref 11.1–15.9)
Immature Grans (Abs): 0 10*3/uL (ref 0.0–0.1)
Immature Granulocytes: 0 %
Lymphocytes Absolute: 2.1 10*3/uL (ref 0.7–3.1)
Lymphs: 35 %
MCH: 34.5 pg — ABNORMAL HIGH (ref 26.6–33.0)
MCHC: 34.5 g/dL (ref 31.5–35.7)
MCV: 100 fL — ABNORMAL HIGH (ref 79–97)
Monocytes Absolute: 0.5 10*3/uL (ref 0.1–0.9)
Monocytes: 8 %
Neutrophils Absolute: 3.2 10*3/uL (ref 1.4–7.0)
Neutrophils: 53 %
Platelets: 327 10*3/uL (ref 150–450)
RBC: 4.32 x10E6/uL (ref 3.77–5.28)
RDW: 12 % (ref 11.7–15.4)
WBC: 6 10*3/uL (ref 3.4–10.8)

## 2021-08-18 LAB — CMP14+EGFR
ALT: 9 IU/L (ref 0–32)
AST: 19 IU/L (ref 0–40)
Albumin/Globulin Ratio: 1.8 (ref 1.2–2.2)
Albumin: 4.4 g/dL (ref 3.9–4.9)
Alkaline Phosphatase: 91 IU/L (ref 44–121)
BUN/Creatinine Ratio: 8 — ABNORMAL LOW (ref 9–23)
BUN: 7 mg/dL (ref 6–20)
Bilirubin Total: 0.4 mg/dL (ref 0.0–1.2)
CO2: 21 mmol/L (ref 20–29)
Calcium: 9.2 mg/dL (ref 8.7–10.2)
Chloride: 106 mmol/L (ref 96–106)
Creatinine, Ser: 0.89 mg/dL (ref 0.57–1.00)
Globulin, Total: 2.5 g/dL (ref 1.5–4.5)
Glucose: 78 mg/dL (ref 70–99)
Potassium: 4.5 mmol/L (ref 3.5–5.2)
Sodium: 141 mmol/L (ref 134–144)
Total Protein: 6.9 g/dL (ref 6.0–8.5)
eGFR: 85 mL/min/{1.73_m2} (ref >59)

## 2021-08-18 LAB — VITAMIN D 25 HYDROXY (VIT D DEFICIENCY, FRACTURES): Vit D, 25-Hydroxy: 10 ng/mL — ABNORMAL LOW (ref 30.0–100.0)

## 2021-08-18 LAB — LIPID PANEL
Chol/HDL Ratio: 2.6 ratio (ref 0.0–4.4)
Cholesterol, Total: 193 mg/dL (ref 100–199)
HDL: 74 mg/dL (ref >39)
LDL Chol Calc (NIH): 108 mg/dL — ABNORMAL HIGH (ref 0–99)
Triglycerides: 58 mg/dL (ref 0–149)
VLDL Cholesterol Cal: 11 mg/dL (ref 5–40)

## 2021-08-18 LAB — TSH: TSH: 1.22 u[IU]/mL (ref 0.450–4.500)

## 2021-08-23 ENCOUNTER — Encounter: Payer: Self-pay | Admitting: Family Medicine

## 2021-08-23 ENCOUNTER — Emergency Department (HOSPITAL_COMMUNITY)
Admission: EM | Admit: 2021-08-23 | Discharge: 2021-08-23 | Discharge status: Against Medical Advice | Payer: 59 | Attending: Emergency Medicine | Admitting: Emergency Medicine

## 2021-08-23 ENCOUNTER — Encounter (HOSPITAL_COMMUNITY): Payer: Self-pay

## 2021-08-23 ENCOUNTER — Ambulatory Visit (INDEPENDENT_AMBULATORY_CARE_PROVIDER_SITE_OTHER): Payer: 59 | Admitting: Family Medicine

## 2021-08-23 ENCOUNTER — Emergency Department (HOSPITAL_COMMUNITY): Payer: 59

## 2021-08-23 ENCOUNTER — Other Ambulatory Visit (HOSPITAL_COMMUNITY): Payer: Self-pay

## 2021-08-23 ENCOUNTER — Other Ambulatory Visit: Payer: Self-pay

## 2021-08-23 VITALS — BP 153/106 | HR 99 | Temp 97.7°F | Resp 16 | Wt 185.0 lb

## 2021-08-23 DIAGNOSIS — R079 Chest pain, unspecified: Secondary | ICD-10-CM | POA: Insufficient documentation

## 2021-08-23 DIAGNOSIS — I10 Essential (primary) hypertension: Secondary | ICD-10-CM | POA: Insufficient documentation

## 2021-08-23 DIAGNOSIS — Z5321 Procedure and treatment not carried out due to patient leaving prior to being seen by health care provider: Secondary | ICD-10-CM | POA: Diagnosis not present

## 2021-08-23 DIAGNOSIS — Z79899 Other long term (current) drug therapy: Secondary | ICD-10-CM | POA: Diagnosis not present

## 2021-08-23 DIAGNOSIS — F17200 Nicotine dependence, unspecified, uncomplicated: Secondary | ICD-10-CM

## 2021-08-23 DIAGNOSIS — F17210 Nicotine dependence, cigarettes, uncomplicated: Secondary | ICD-10-CM

## 2021-08-23 LAB — BASIC METABOLIC PANEL
Anion gap: 4 — ABNORMAL LOW (ref 5–15)
BUN: 9 mg/dL (ref 6–20)
CO2: 23 mmol/L (ref 22–32)
Calcium: 8.8 mg/dL — ABNORMAL LOW (ref 8.9–10.3)
Chloride: 112 mmol/L — ABNORMAL HIGH (ref 98–111)
Creatinine, Ser: 0.93 mg/dL (ref 0.44–1.00)
GFR, Estimated: >60 mL/min (ref >60)
Glucose, Bld: 93 mg/dL (ref 70–99)
Potassium: 4.2 mmol/L (ref 3.5–5.1)
Sodium: 139 mmol/L (ref 135–145)

## 2021-08-23 LAB — CBC
HCT: 41.9 % (ref 36.0–46.0)
Hemoglobin: 14.4 g/dL (ref 12.0–15.0)
MCH: 34.5 pg — ABNORMAL HIGH (ref 26.0–34.0)
MCHC: 34.4 g/dL (ref 30.0–36.0)
MCV: 100.5 fL — ABNORMAL HIGH (ref 80.0–100.0)
Platelets: 339 10*3/uL (ref 150–400)
RBC: 4.17 MIL/uL (ref 3.87–5.11)
RDW: 12.7 % (ref 11.5–15.5)
WBC: 6.2 10*3/uL (ref 4.0–10.5)
nRBC: 0 % (ref 0.0–0.2)

## 2021-08-23 LAB — I-STAT BETA HCG BLOOD, ED (MC, WL, AP ONLY): I-stat hCG, quantitative: <5 m[IU]/mL (ref <5)

## 2021-08-23 LAB — TROPONIN I (HIGH SENSITIVITY): Troponin I (High Sensitivity): 3 ng/L (ref <18)

## 2021-08-23 MED ORDER — LOSARTAN POTASSIUM 100 MG PO TABS: 100.0000 mg | ORAL_TABLET | Freq: Every day | ORAL | 0 refills | Status: DC

## 2021-08-23 MED ORDER — HYDROCHLOROTHIAZIDE 25 MG PO TABS: 25.0000 mg | ORAL_TABLET | Freq: Every day | ORAL | 0 refills | Status: DC

## 2021-08-23 MED ORDER — HYDROCHLOROTHIAZIDE 25 MG PO TABS
25.0000 mg | ORAL_TABLET | Freq: Every day | ORAL | 0 refills | Status: DC
  Filled 2021-08-23: qty 30, 30d supply, fill #0

## 2021-08-23 MED ORDER — LOSARTAN POTASSIUM 100 MG PO TABS
100.0000 mg | ORAL_TABLET | Freq: Every day | ORAL | 0 refills | Status: DC
  Filled 2021-08-23: qty 30, 30d supply, fill #0

## 2021-08-23 NOTE — ED Provider Triage Note (Signed)
Emergency Medicine Provider Triage Evaluation Note  Katherine Knight , a 41 y.o. female  was evaluated in triage.  Pt complains of CP intermittent for years, worse for the past week. Pain is intermittent, located right side chest, radiates up neck- today moves left to right with left arm pain which prompted eval at PCP who sent to the ER. Hx HTN, on medication, no missed doses (just restarted meds last week, increase in losartan today at visit but hasn't started increased dose). Pain is worse with- nothing, lasts about 1 hour. Knowing her BP is high is prompting increase in concern  Review of Systems  Positive: CP, arm pain, slight SHOB, feeling light headed earlier  Negative: Nausea, diaphoresis   Physical Exam  BP (!) 179/123   Pulse 78   Temp 98.4 F (36.9 C) (Oral)   Resp 16   SpO2 100%  Gen:   Awake, no distress   Resp:  Normal effort  MSK:   Moves extremities without difficulty  Other:    Medical Decision Making  Medically screening exam initiated at 11:09 AM.  Appropriate orders placed.  Katherine Knight was informed that the remainder of the evaluation will be completed by another provider, this initial triage assessment does not replace that evaluation, and the importance of remaining in the ED until their evaluation is complete.     Jeannie Fend, PA-C 08/23/21 1112

## 2021-08-23 NOTE — ED Triage Notes (Signed)
Pt reports intermittent chest pain for awhile now. Pt reports going to PCP today and was told her BP was high despite taking BP medication and her EKG was questionable. Pt was sent here for further evaluation.

## 2021-08-24 ENCOUNTER — Other Ambulatory Visit: Payer: Self-pay | Admitting: Family Medicine

## 2021-08-24 ENCOUNTER — Encounter (HOSPITAL_COMMUNITY): Payer: Self-pay

## 2021-08-24 ENCOUNTER — Emergency Department (HOSPITAL_COMMUNITY): Payer: 59

## 2021-08-24 ENCOUNTER — Emergency Department (HOSPITAL_COMMUNITY)
Admission: EM | Admit: 2021-08-24 | Discharge: 2021-08-24 | Disposition: A | Discharge status: Home / Self Care | Payer: 59 | Attending: Emergency Medicine | Admitting: Emergency Medicine

## 2021-08-24 ENCOUNTER — Encounter: Payer: Self-pay | Admitting: Family Medicine

## 2021-08-24 DIAGNOSIS — R519 Headache, unspecified: Secondary | ICD-10-CM | POA: Diagnosis not present

## 2021-08-24 DIAGNOSIS — R079 Chest pain, unspecified: Secondary | ICD-10-CM | POA: Insufficient documentation

## 2021-08-24 DIAGNOSIS — Z79899 Other long term (current) drug therapy: Secondary | ICD-10-CM | POA: Insufficient documentation

## 2021-08-24 DIAGNOSIS — R42 Dizziness and giddiness: Secondary | ICD-10-CM | POA: Diagnosis not present

## 2021-08-24 DIAGNOSIS — I10 Essential (primary) hypertension: Secondary | ICD-10-CM | POA: Diagnosis not present

## 2021-08-24 LAB — COMPREHENSIVE METABOLIC PANEL
ALT: 11 U/L (ref 0–44)
AST: 16 U/L (ref 15–41)
Albumin: 3.9 g/dL (ref 3.5–5.0)
Alkaline Phosphatase: 80 U/L (ref 38–126)
Anion gap: 4 — ABNORMAL LOW (ref 5–15)
BUN: 9 mg/dL (ref 6–20)
CO2: 22 mmol/L (ref 22–32)
Calcium: 8.7 mg/dL — ABNORMAL LOW (ref 8.9–10.3)
Chloride: 112 mmol/L — ABNORMAL HIGH (ref 98–111)
Creatinine, Ser: 0.88 mg/dL (ref 0.44–1.00)
GFR, Estimated: >60 mL/min (ref >60)
Glucose, Bld: 89 mg/dL (ref 70–99)
Potassium: 4 mmol/L (ref 3.5–5.1)
Sodium: 138 mmol/L (ref 135–145)
Total Bilirubin: 0.5 mg/dL (ref 0.3–1.2)
Total Protein: 7.1 g/dL (ref 6.5–8.1)

## 2021-08-24 LAB — CBC WITH DIFFERENTIAL/PLATELET
Abs Immature Granulocytes: 0.02 10*3/uL (ref 0.00–0.07)
Basophils Absolute: 0.1 10*3/uL (ref 0.0–0.1)
Basophils Relative: 1 %
Eosinophils Absolute: 0.3 10*3/uL (ref 0.0–0.5)
Eosinophils Relative: 4 %
HCT: 43.2 % (ref 36.0–46.0)
Hemoglobin: 14.8 g/dL (ref 12.0–15.0)
Immature Granulocytes: 0 %
Lymphocytes Relative: 37 %
Lymphs Abs: 2.4 10*3/uL (ref 0.7–4.0)
MCH: 34.5 pg — ABNORMAL HIGH (ref 26.0–34.0)
MCHC: 34.3 g/dL (ref 30.0–36.0)
MCV: 100.7 fL — ABNORMAL HIGH (ref 80.0–100.0)
Monocytes Absolute: 0.5 10*3/uL (ref 0.1–1.0)
Monocytes Relative: 7 %
Neutro Abs: 3.4 10*3/uL (ref 1.7–7.7)
Neutrophils Relative %: 51 %
Platelets: 337 10*3/uL (ref 150–400)
RBC: 4.29 MIL/uL (ref 3.87–5.11)
RDW: 12.6 % (ref 11.5–15.5)
WBC: 6.6 10*3/uL (ref 4.0–10.5)
nRBC: 0 % (ref 0.0–0.2)

## 2021-08-24 LAB — TROPONIN I (HIGH SENSITIVITY): Troponin I (High Sensitivity): 4 ng/L (ref <18)

## 2021-08-24 MED ORDER — METOCLOPRAMIDE HCL 5 MG/ML IJ SOLN
10.0000 mg | Freq: Once | INTRAMUSCULAR | Status: AC
  Administered 2021-08-24: 10 mg via INTRAVENOUS
  Filled 2021-08-24: qty 2

## 2021-08-24 MED ORDER — VITAMIN D (ERGOCALCIFEROL) 1.25 MG (50000 UNIT) PO CAPS: 50000.0000 [IU] | ORAL_CAPSULE | ORAL | 0 refills | Status: AC

## 2021-08-24 MED ORDER — SODIUM CHLORIDE 0.9 % IV BOLUS
1000.0000 mL | Freq: Once | INTRAVENOUS | Status: AC
Start: 2021-08-24 — End: 2021-08-24
  Administered 2021-08-24: 1000 mL via INTRAVENOUS

## 2021-08-24 MED ORDER — LOSARTAN POTASSIUM 25 MG PO TABS
100.0000 mg | ORAL_TABLET | Freq: Every day | ORAL | Status: DC
  Administered 2021-08-24: 100 mg via ORAL
  Filled 2021-08-24: qty 4

## 2021-08-24 MED ORDER — DIPHENHYDRAMINE HCL 50 MG/ML IJ SOLN
25.0000 mg | Freq: Once | INTRAMUSCULAR | Status: AC
  Administered 2021-08-24: 25 mg via INTRAVENOUS
  Filled 2021-08-24: qty 1

## 2021-08-24 NOTE — Discharge Instructions (Addendum)
Please continue taking your medication for pain control.  Follow up with your primary care physician.  If you experience any worsening symptoms please return to the ED.

## 2021-08-24 NOTE — ED Triage Notes (Signed)
Pt states for the past two days she has had a headache and her blood pressure has been higher than normal. Denies any other associated symptoms.

## 2021-08-24 NOTE — Progress Notes (Signed)
Established Patient Office Visit  Subjective    Patient ID: Katherine Knight, female    DOB: 09/09/1980  Age: 41 y.o. MRN: [id number]  CC:  Chief Complaint  Patient presents with   Hypertension   Follow-up    HPI Katherine Knight presents for follow up of hypertension. She reports that she has been taking her meds as recommended. She also reports chest pain that started this morning with +/- intermittent SOB. Patient does smoke. She also reports heavy social stressors.    No facility-administered encounter medications on file as of 08/23/2021.   Outpatient Encounter Medications as of 08/23/2021  Medication Sig   [DISCONTINUED] hydrochlorothiazide (HYDRODIURIL) 25 MG tablet Take 1 tablet (25 mg total) by mouth daily.   [DISCONTINUED] losartan (COZAAR) 100 MG tablet Take 1 tablet (100 mg total) by mouth daily.   albuterol (VENTOLIN HFA) 108 (90 Base) MCG/ACT inhaler Inhale 1-2 puffs into the lungs every 6 (six) hours as needed for wheezing or shortness of breath. (Patient not taking: Reported on 08/17/2021)   amLODipine (NORVASC) 10 MG tablet Take 1 tablet (10 mg total) by mouth daily. (Patient not taking: Reported on 08/17/2021)   buPROPion (WELLBUTRIN SR) 150 MG 12 hr tablet Take 1 tablet (150 mg total) by mouth 2 (two) times daily. (Patient not taking: Reported on 08/17/2021)   hydrochlorothiazide (HYDRODIURIL) 25 MG tablet Take 1 tablet (25 mg total) by mouth daily.   losartan (COZAAR) 100 MG tablet Take 1 tablet (100 mg total) by mouth daily.   [DISCONTINUED] losartan (COZAAR) 50 MG tablet Take 1 tablet (50 mg total) by mouth daily.    Past Medical History:  Diagnosis Date   Asthma    Depression    Hypertension     No past surgical history on file.  No family history on file.  Social History   Socioeconomic History   Marital status: Divorced    Spouse name: Not on file   Number of children: Not on file   Years of education: Not on file   Highest education level: Not on  file  Occupational History   Not on file  Tobacco Use   Smoking status: Every Day    Packs/day: 1.00    Years: 15.00    Total pack years: 15.00    Types: Cigarettes   Smokeless tobacco: Never  Vaping Use   Vaping Use: Never used  Substance and Sexual Activity   Alcohol use: Yes   Drug use: No   Sexual activity: Yes    Birth control/protection: Implant  Other Topics Concern   Not on file  Social History Narrative   Not on file   Social Determinants of Health   Financial Resource Strain: Not on file  Food Insecurity: Not on file  Transportation Needs: Not on file  Physical Activity: Not on file  Stress: Not on file  Social Connections: Not on file  Intimate Partner Violence: Not on file    Review of Systems  Constitutional:  Negative for chills and fever.  Respiratory:  Positive for shortness of breath.   Cardiovascular:  Positive for chest pain. Negative for leg swelling.  All other systems reviewed and are negative.       Objective    BP (!) 153/106   Pulse 99   Temp 97.7 F (36.5 C) (Oral)   Resp 16   Wt 185 lb (83.9 kg)   SpO2 97%   BMI 32.77 kg/m   Physical Exam Vitals and nursing  note reviewed.  Constitutional:      General: She is not in acute distress. HENT:     Head: Normocephalic and atraumatic.  Cardiovascular:     Rate and Rhythm: Normal rate and regular rhythm.  Pulmonary:     Effort: Pulmonary effort is normal.     Breath sounds: Normal breath sounds.  Abdominal:     Palpations: Abdomen is soft.     Tenderness: There is no abdominal tenderness.  Neurological:     General: No focal deficit present.     Mental Status: She is alert and oriented to person, place, and time.         Assessment & Plan:   1. Chest pain, unspecified type Recommended urgent eval by ED 2/2 symptoms and history. Patient refused transport by EMS and appeared to be stable for transport by PV. Patient v.u. - EKG 12-Lead  2. Essential  hypertension Elevated readings. Will increase losartan and add HCTZ to regimen  3. TOBACCO DEPENDENCE Discussed reduction/cessation    Return in about 2 weeks (around 09/06/2021) for follow up.   Tommie Raymond, MD

## 2021-08-24 NOTE — ED Provider Triage Note (Signed)
Emergency Medicine Provider Triage Evaluation Note  Katherine Knight , a 41 y.o. female  was evaluated in triage.  Pt complains of high blood pressure.  Recently started on hypertensive medication approximately 1 week ago, reports her doctor wanted to see that the medication was working therefore she was sent to the ED.  Reports while in the ED blood pressure became more elevated.  Complaining of a headache that feels "like pressure ".  Did not take her blood pressure medications this morning..  Review of Systems  Positive: Headache, chest pain Negative: Fever, cough  Physical Exam  BP (!) 171/118   Pulse 82   Temp 98.1 F (36.7 C) (Oral)   Resp 18   SpO2 99%  Gen:   Awake, no distress   Resp:  Normal effort  MSK:   Moves extremities without difficulty  Other:    Medical Decision Making  Medically screening exam initiated at 9:53 AM.  Appropriate orders placed.  Katherine Knight was informed that the remainder of the evaluation will be completed by another provider, this initial triage assessment does not replace that evaluation, and the importance of remaining in the ED until their evaluation is complete.     Claude Manges, PA-C 08/24/21 [phone]

## 2021-08-24 NOTE — ED Provider Notes (Addendum)
[facility]-EMERGENCY DEPT Provider Note   CSN: [id number] Arrival date & time: 08/24/21  0931     History HTN Chief Complaint  Patient presents with   Hypertension   Headache    Katherine Knight is a 41 y.o. female.  41 year old female with a past medical history of hypertension currently on and 100 mg daily presents to the ED with a chief complaint of headache, chest pain.  Patient reports she recently was placed on pressure medication, she has been taking this for a week.  She was evaluated by her PCP yesterday who recommend that she be seen in the emergency department due to continued hypertension.  Patient endorses a headache, to the posterior aspect of her head which she feels like "pressure like a balloon to the posterior aspect ".  There is no alleviating or exacerbating factors.  She also endorses pain along her chest, this is generalized pressure to the entire chest.  She does not have any history of migraines in the past.  Systolics have been running at home around 180s, she is unsure about her diastolic pressure.  She did not take her blood pressure medications this morning.  She does not have any changes in vision, nausea, vomiting.  No prior history of CAD, her father did have an MI at the age of 53.  She does endorse tobacco use daily.  The history is provided by the patient and medical records.  Hypertension This is a new problem. Associated symptoms include chest pain and headaches. Pertinent negatives include no abdominal pain and no shortness of breath.  Headache Associated symptoms: dizziness   Associated symptoms: no abdominal pain, no back pain, no fever, no nausea and no vomiting        Home Medications Prior to Admission medications   Medication Sig Start Date End Date Taking? Authorizing Provider  hydrochlorothiazide (HYDRODIURIL) 25 MG tablet Take 1 tablet (25 mg total) by mouth daily. 08/23/21  Yes Georganna Skeans, MD  losartan (COZAAR)  100 MG tablet Take 1 tablet (100 mg total) by mouth daily. 08/23/21  Yes Georganna Skeans, MD  Vitamin D, Ergocalciferol, (DRISDOL) 1.25 MG (50000 UNIT) CAPS capsule Take 1 capsule (50,000 Units total) by mouth every 7 (seven) days. 08/24/21  Yes Georganna Skeans, MD      Allergies    Patient has no known allergies.    Review of Systems   Review of Systems  Constitutional:  Negative for chills and fever.  Respiratory:  Negative for shortness of breath.   Cardiovascular:  Positive for chest pain.  Gastrointestinal:  Negative for abdominal pain, nausea and vomiting.  Genitourinary:  Negative for flank pain.  Musculoskeletal:  Negative for back pain.  Neurological:  Positive for dizziness and headaches.  All other systems reviewed and are negative.   Physical Exam Updated Vital Signs BP (!) 164/117   Pulse 90   Temp 98.4 F (36.9 C) (Oral)   Resp 18   SpO2 100%  Physical Exam Vitals and nursing note reviewed.  Constitutional:      Appearance: She is well-developed.  HENT:     Head: Normocephalic and atraumatic.  Eyes:     Extraocular Movements: Extraocular movements intact.  Cardiovascular:     Rate and Rhythm: Normal rate.  Pulmonary:     Effort: Pulmonary effort is normal.     Breath sounds: No wheezing.  Abdominal:     Palpations: Abdomen is soft.     Tenderness: There is no abdominal  tenderness.  Musculoskeletal:     Cervical back: Normal range of motion and neck supple.  Skin:    General: Skin is warm.  Neurological:     Mental Status: She is alert and oriented to person, place, and time.     GCS: GCS eye subscore is 4. GCS verbal subscore is 5. GCS motor subscore is 6.     Comments: Alert, oriented, thought content appropriate. Speech fluent without evidence of aphasia. Able to follow 2 step commands without difficulty.  Cranial Nerves:  II:  Peripheral visual fields grossly normal, pupils, round, reactive to light III,IV, VI: ptosis not present, extra-ocular motions  intact bilaterally  V,VII: smile symmetric, facial light touch sensation equal VIII: hearing grossly normal bilaterally  IX,X: midline uvula rise  XI: bilateral shoulder shrug equal and strong XII: midline tongue extension  Motor:  5/5 in upper and lower extremities bilaterally including strong and equal grip strength and dorsiflexion/plantar flexion Sensory: light touch normal in all extremities.  Cerebellar: normal finger-to-nose with bilateral upper extremities, pronator drift negative Gait: normal gait and balance       ED Results / Procedures / Treatments   Labs (all labs ordered are listed, but only abnormal results are displayed) Labs Reviewed  CBC WITH DIFFERENTIAL/PLATELET - Abnormal; Notable for the following components:      Result Value   MCV 100.7 (*)    MCH 34.5 (*)    All other components within normal limits  COMPREHENSIVE METABOLIC PANEL - Abnormal; Notable for the following components:   Chloride 112 (*)    Calcium 8.7 (*)    Anion gap 4 (*)    All other components within normal limits  TROPONIN I (HIGH SENSITIVITY)  TROPONIN I (HIGH SENSITIVITY)    EKG None  Radiology CT Head Wo Contrast  Result Date: 08/24/2021 CLINICAL DATA:  Headache EXAM: CT HEAD WITHOUT CONTRAST TECHNIQUE: Contiguous axial images were obtained from the base of the skull through the vertex without intravenous contrast. RADIATION DOSE REDUCTION: This exam was performed according to the departmental dose-optimization program which includes automated exposure control, adjustment of the mA and/or kV according to patient size and/or use of iterative reconstruction technique. COMPARISON:  None Available. FINDINGS: Brain: No evidence of acute infarction, hemorrhage, hydrocephalus, extra-axial collection or mass lesion/mass effect. Vascular: No hyperdense vessel or unexpected calcification. Skull: There are 2 well-defined oval ossified lesions originating from the bifrontal skull (series 3,  images 35 and 38), without mass effect on the adjacent cortex. This is favored to be a benign entity such as an osteoma. Sinuses/Orbits: No acute finding. Other: None. IMPRESSION: No acute intracranial abnormality. Electronically Signed   By: Jacob Moores M.D.   On: 08/24/2021 14:14   DG Chest 2 View  Result Date: 08/24/2021 CLINICAL DATA:  41 year old female with chest pain. EXAM: CHEST - 2 VIEW COMPARISON:  08/23/2021 FINDINGS: The mediastinal contours are within normal limits. No cardiomegaly. The lungs are clear bilaterally without evidence of focal consolidation, pleural effusion, or pneumothorax. No acute osseous abnormality. IMPRESSION: No acute cardiopulmonary process. Electronically Signed   By: Marliss Coots M.D.   On: 08/24/2021 12:36   DG Chest 2 View  Result Date: 08/23/2021 CLINICAL DATA:  A 41 year old female presents for evaluation of chest pain. EXAM: CHEST - 2 VIEW COMPARISON:  February 23, 2013 FINDINGS: Cardiomediastinal contours and hilar structures are stable. Heart size top normal. EKG leads project over the chest. No signs of lobar consolidation or pleural effusion. No visible pneumothorax.  Mild dextroconvex curvature of the thoracic spine shows no change. No acute skeletal findings on limited assessment. IMPRESSION: No active cardiopulmonary disease. Electronically Signed   By: Donzetta Kohut M.D.   On: 08/23/2021 11:12    Procedures Procedures    Medications Ordered in ED Medications  losartan (COZAAR) tablet 100 mg (100 mg Oral Given 08/24/21 1159)  diphenhydrAMINE (BENADRYL) injection 25 mg (25 mg Intravenous Given 08/24/21 1300)  metoCLOPramide (REGLAN) injection 10 mg (10 mg Intravenous Given 08/24/21 1301)  sodium chloride 0.9 % bolus 1,000 mL (1,000 mLs Intravenous New Bag/Given 08/24/21 1300)    ED Course/ Medical Decision Making/ A&P                           Medical Decision Making Amount and/or Complexity of Data Reviewed Labs: ordered. Radiology:  ordered.  Risk Prescription drug management.  This patient presents to the ED for concern of headache, HTN, this involves a number of treatment options, and is a complaint that carries with it a high risk of complications and morbidity.  The differential diagnosis includes PRESS, HTN emergency versus urgency.    Co morbidities: Discussed in HPI   Brief History:  Patient with HTN started on medication about 1 week ago, PCP sent her here for elevated BP along with headache and chest pain. No CAD history, does endorses tobacco use, but family hx of CAD. Headache with pressure in the occipital area.   EMR reviewed including pt PMHx, past surgical history and past visits to ER.   See HPI for more details   Lab Tests:  I ordered and independently interpreted labs.  The pertinent results include:    I personally reviewed all laboratory work and imaging. Metabolic panel without any acute abnormality specifically kidney function within normal limits and no significant electrolyte abnormalities. CBC without leukocytosis or significant anemia.Troponin is within normal limits.    Imaging Studies:  NAD. I personally reviewed all imaging studies and no acute abnormality found. I agree with radiology interpretation.  EKG: NSR, non ischemic.  Medicines ordered:  I ordered medication including benadryl, reglan,bolus  for symptomatic treatment Reevaluation of the patient after these medicines showed that the patient resolved I have reviewed the patients home medicines and have made adjustments as needed  Reevaluation:  After the interventions noted above I re-evaluated patient and found that they have :improved   Social Determinants of Health:  The patient's social determinants of health were a factor in the care of this patient    Problem List / ED Course:  Patient with underlying history of hypertension currently on losartan 100 mg daily presents to the ED with chest pain along  with headache.  Patient has been compliant with medication however blood pressure is noted to be elevated.  However after extensive chart review I do see patient's prior visit 2 years ago systolic in the 150s diastolic in the lower 100.s neuro exam is unremarkable.  She is ambulatory in the ED with a steady gait.  She is complaining of a headache head CT obtained which did not show any acute finding.  Labs are unremarkable without any signs of elevated creatinine, troponins are negative chest pain has been ongoing for the past 2 days.  She does not have any prior history of cardiac disease but does endorse tobacco use.  Dust x-ray without any acute findings.  Given Reglan, Benadryl for symptomatic treatment.  Reports headache is now resolved.  Labs are  benign.  I did discuss with her appropriate follow-up with PCP.  She is to return to the emergency department if any of her symptoms worsen.  Heart score 1-2, feel appropriate for outpatient management.    Dispostion:  After consideration of the diagnostic results and the patients response to treatment, I feel that the patent would benefit from outpatient pcp. Perhaps titration of medication.    Portions of this note were generated with Scientist, clinical (histocompatibility and immunogenetics). Dictation errors may occur despite best attempts at proofreading.   Final Clinical Impression(s) / ED Diagnoses Final diagnoses:  Bad headache    Rx / DC Orders ED Discharge Orders     None         Claude Manges, PA-C 08/24/21 1446    Claude Manges, PA-C 08/24/21 1454    Lorre Nick, MD 08/26/21 1306

## 2021-08-26 ENCOUNTER — Telehealth: Payer: Self-pay

## 2021-08-26 NOTE — Telephone Encounter (Signed)
Att to contact pt to verify issue no ans

## 2021-08-30 ENCOUNTER — Encounter: Payer: Self-pay | Admitting: Family Medicine

## 2021-08-30 NOTE — Telephone Encounter (Signed)
I have attempted without success to contact this patient by phone to return their call.I also LVM on  machine.

## 2021-09-02 ENCOUNTER — Other Ambulatory Visit: Payer: Self-pay | Admitting: Family Medicine

## 2021-09-03 ENCOUNTER — Other Ambulatory Visit: Payer: Self-pay | Admitting: Family Medicine

## 2021-09-05 NOTE — Telephone Encounter (Signed)
Requested Prescriptions  Pending Prescriptions Disp Refills  . losartan (COZAAR) 50 MG tablet [Pharmacy Med Name: LOSARTAN POTASSIUM 50 MG TAB] 30 tablet 0    Sig: TAKE 1 TABLET BY MOUTH EVERY DAY     Cardiovascular:  Angiotensin Receptor Blockers Failed - 09/03/2021 10:33 AM      Failed - Last BP in normal range    BP Readings from Last 1 Encounters:  08/24/21 (!) 164/117         Passed - Cr in normal range and within 180 days    Creatinine, Ser  Date Value Ref Range Status  08/24/2021 0.88 0.44 - 1.00 mg/dL Final         Passed - K in normal range and within 180 days    Potassium  Date Value Ref Range Status  08/24/2021 4.0 3.5 - 5.1 mmol/L Final         Passed - Patient is not pregnant      Passed - Valid encounter within last 6 months    Recent Outpatient Visits          1 week ago Chest pain, unspecified type   Primary [facility] at [facility], MD   2 weeks ago Annual physical exam   Primary Care at [facility], MD   1 year ago Essential hypertension   Primary Care at [facility], Kandee Keen, MD   1 year ago Varicose veins of both lower extremities with pain   Primary Care at [facility], Godfrey Pick, FNP   1 year ago Essential hypertension   Primary Care at [facility], Kandee Keen, MD      Future Appointments            Tomorrow Georganna Skeans, MD Primary Care at [facility]

## 2021-09-06 ENCOUNTER — Ambulatory Visit (INDEPENDENT_AMBULATORY_CARE_PROVIDER_SITE_OTHER): Payer: 59 | Admitting: Family Medicine

## 2021-09-06 ENCOUNTER — Encounter: Payer: Self-pay | Admitting: Family Medicine

## 2021-09-06 VITALS — BP 153/106 | HR 76 | Temp 97.8°F | Resp 16 | Wt 192.2 lb

## 2021-09-06 DIAGNOSIS — I10 Essential (primary) hypertension: Secondary | ICD-10-CM | POA: Diagnosis not present

## 2021-09-06 DIAGNOSIS — F17200 Nicotine dependence, unspecified, uncomplicated: Secondary | ICD-10-CM | POA: Diagnosis not present

## 2021-09-06 MED ORDER — AMLODIPINE BESYLATE 10 MG PO TABS: 10.0000 mg | ORAL_TABLET | Freq: Every day | ORAL | 0 refills | Status: DC

## 2021-09-06 NOTE — Progress Notes (Signed)
Patient is here for f/u HTN. Patient has been out of medication. Patient had a reaction to HCTZ, N/V/ and swollen legs, passing out

## 2021-09-06 NOTE — Progress Notes (Signed)
Established Patient Office Visit  Subjective    Patient ID: Katherine Knight, female    DOB: Sep 02, 1980  Age: 41 y.o. MRN: [id number]  CC:  Chief Complaint  Patient presents with   Follow-up    HPI [name] presents for follow up of hypertension. She had to stop HCTZ 2/2  N/V.   Outpatient Encounter Medications as of 09/06/2021  Medication Sig   amLODipine (NORVASC) 10 MG tablet Take 1 tablet (10 mg total) by mouth daily.   losartan (COZAAR) 100 MG tablet Take 1 tablet (100 mg total) by mouth daily.   Vitamin D, Ergocalciferol, (DRISDOL) 1.25 MG (50000 UNIT) CAPS capsule Take 1 capsule (50,000 Units total) by mouth every 7 (seven) days.   hydrochlorothiazide (HYDRODIURIL) 25 MG tablet Take 1 tablet (25 mg total) by mouth daily. (Patient not taking: Reported on 09/06/2021)   No facility-administered encounter medications on file as of 09/06/2021.    Past Medical History:  Diagnosis Date   Asthma    Depression    Hypertension     History reviewed. No pertinent surgical history.  History reviewed. No pertinent family history.  Social History   Socioeconomic History   Marital status: Divorced    Spouse name: Not on file   Number of children: Not on file   Years of education: Not on file   Highest education level: Not on file  Occupational History   Not on file  Tobacco Use   Smoking status: Every Day    Packs/day: 1.00    Years: 15.00    Total pack years: 15.00    Types: Cigarettes   Smokeless tobacco: Never  Vaping Use   Vaping Use: Never used  Substance and Sexual Activity   Alcohol use: Yes   Drug use: No   Sexual activity: Yes    Birth control/protection: Implant  Other Topics Concern   Not on file  Social History Narrative   Not on file   Social Determinants of Health   Financial Resource Strain: Not on file  Food Insecurity: Not on file  Transportation Needs: Not on file  Physical Activity: Not on file  Stress: Not on file  Social  Connections: Not on file  Intimate Partner Violence: Not on file    Review of Systems  All other systems reviewed and are negative.       Objective    BP (!) 153/106   Pulse 76   Temp 97.8 F (36.6 C) (Oral)   Resp 16   Wt 192 lb 3.2 oz (87.2 kg)   SpO2 98%   BMI 34.05 kg/m   Physical Exam Vitals and nursing note reviewed.  Constitutional:      General: She is not in acute distress. Cardiovascular:     Rate and Rhythm: Normal rate and regular rhythm.  Pulmonary:     Effort: Pulmonary effort is normal.     Breath sounds: Normal breath sounds.  Abdominal:     Palpations: Abdomen is soft.     Tenderness: There is no abdominal tenderness.  Musculoskeletal:     Right lower leg: No edema.     Left lower leg: No edema.  Neurological:     General: No focal deficit present.     Mental Status: She is alert and oriented to person, place, and time.         Assessment & Plan:   1. Essential hypertension Elevated readings. D/c HCTZ. Will add amlodipine 10 mg to regimen  and monitor  2. TOBACCO DEPENDENCE Discussed cessation/reduction    Return in about 2 weeks (around 09/20/2021).   Tommie Raymond, MD

## 2021-09-23 ENCOUNTER — Ambulatory Visit (INDEPENDENT_AMBULATORY_CARE_PROVIDER_SITE_OTHER): Payer: 59 | Admitting: Family Medicine

## 2021-09-23 ENCOUNTER — Telehealth: Payer: Self-pay | Admitting: Family Medicine

## 2021-09-23 ENCOUNTER — Encounter: Payer: Self-pay | Admitting: Family Medicine

## 2021-09-23 VITALS — BP 126/87 | HR 85 | Temp 97.7°F | Resp 16 | Wt 185.4 lb

## 2021-09-23 DIAGNOSIS — F32A Depression, unspecified: Secondary | ICD-10-CM

## 2021-09-23 DIAGNOSIS — F17200 Nicotine dependence, unspecified, uncomplicated: Secondary | ICD-10-CM | POA: Diagnosis not present

## 2021-09-23 DIAGNOSIS — I10 Essential (primary) hypertension: Secondary | ICD-10-CM

## 2021-09-23 DIAGNOSIS — F419 Anxiety disorder, unspecified: Secondary | ICD-10-CM

## 2021-09-23 MED ORDER — LOSARTAN POTASSIUM 100 MG PO TABS
100.0000 mg | ORAL_TABLET | Freq: Every day | ORAL | 1 refills | Status: DC
Start: 2021-09-23 — End: 2022-05-15

## 2021-09-23 MED ORDER — AMLODIPINE BESYLATE 10 MG PO TABS: 10.0000 mg | ORAL_TABLET | Freq: Every day | ORAL | 1 refills | Status: DC

## 2021-09-23 NOTE — Telephone Encounter (Signed)
Per PCP- Please schedule refer to [facility] for counseling.   Thank you.

## 2021-09-24 ENCOUNTER — Encounter: Payer: Self-pay | Admitting: Family Medicine

## 2021-09-24 NOTE — Progress Notes (Signed)
   Established Patient Office Visit  Subjective    Patient ID: Katherine Knight, female    DOB: May 19, 1980  Age: 41 y.o. MRN: [id number]  CC: No chief complaint on file.   HPI KENIKA SAHM presents for follow up of hypertension. Patient denies acute complaints.    Outpatient Encounter Medications as of 09/23/2021  Medication Sig   Vitamin D, Ergocalciferol, (DRISDOL) 1.25 MG (50000 UNIT) CAPS capsule Take 1 capsule (50,000 Units total) by mouth every 7 (seven) days.   [DISCONTINUED] amLODipine (NORVASC) 10 MG tablet Take 1 tablet (10 mg total) by mouth daily.   [DISCONTINUED] hydrochlorothiazide (HYDRODIURIL) 25 MG tablet Take 1 tablet (25 mg total) by mouth daily.   [DISCONTINUED] losartan (COZAAR) 100 MG tablet Take 1 tablet (100 mg total) by mouth daily.   amLODipine (NORVASC) 10 MG tablet Take 1 tablet (10 mg total) by mouth daily.   losartan (COZAAR) 100 MG tablet Take 1 tablet (100 mg total) by mouth daily.   No facility-administered encounter medications on file as of 09/23/2021.    Past Medical History:  Diagnosis Date   Asthma    Depression    Hypertension     No past surgical history on file.  No family history on file.  Social History   Socioeconomic History   Marital status: Divorced    Spouse name: Not on file   Number of children: Not on file   Years of education: Not on file   Highest education level: Not on file  Occupational History   Not on file  Tobacco Use   Smoking status: Every Day    Packs/day: 1.00    Years: 15.00    Total pack years: 15.00    Types: Cigarettes   Smokeless tobacco: Never  Vaping Use   Vaping Use: Never used  Substance and Sexual Activity   Alcohol use: Yes   Drug use: No   Sexual activity: Yes    Birth control/protection: Implant  Other Topics Concern   Not on file  Social History Narrative   Not on file   Social Determinants of Health   Financial Resource Strain: Not on file  Food Insecurity: Not on file   Transportation Needs: Not on file  Physical Activity: Not on file  Stress: Not on file  Social Connections: Not on file  Intimate Partner Violence: Not on file    ROS      Objective    BP 126/87   Pulse 85   Temp 97.7 F (36.5 C) (Oral)   Resp 16   Wt 185 lb 6.4 oz (84.1 kg)   SpO2 98%   BMI 32.84 kg/m   Physical Exam      Assessment & Plan:   1. Essential hypertension Much improved with present management. Meds refilled. Continue   2. TOBACCO DEPENDENCE Discussed cessation/reduction  3. Anxiety and depression Referral to SW for further eval/mgt    Return in about 3 months (around 12/23/2021) for follow up.   Becky Sax, MD

## 2021-10-05 NOTE — Telephone Encounter (Signed)
LCSWA called patient today to introduce herself and to assess patients' mental health needs. Patient was referred by PCP for therapy. After speaking with patient, she stated that she is having some anxiety and depression and would like to speak with someone often and more long term. LCSWA sent patient the following resources in MyChart.   Counseling Resources   [email or web address].hu   [facility] [place] (Therapy only)  [facility] [address], [place], [place] [address] Monday - Friday: 8:30 a.m.-12 p.m. / 1 p.m.-2:30 p.m.  [facility] [address], [place], [place] [address] Monday-Friday: 8:30 a.m.-12 p.m. / 2-3:30 p.m. (INSURANCE REQUIRED -MEDICAID ACCEPTED) They do offer a sliding fee scale $20-$30/session  [facility] [place], [place] [address] Phone: [place] [address] [place] [address]  Phone: [phone] (Does not accept Medicaid) (only one provider accepts Medicare)  [facility] [address]at [employer], [place] [address] (Accepts Medicaid and Medicare)  [facility]  [address]  [place], [place] [address]  Phone: [phone]  [address] [place], [place] [address] Phone: [phone] [facility] Medicaid) [facility] (Therapy only)  [address] [place], [place] [address] Phone: [phone]   [facility] [place] (Therapy only)  [place], [place] [address] Phone: [phone] [facility])   [place] [address] [place], [place] [address] Phone: [phone] ([place]) [employer] [facility] [phone] [address] [place], [place] [address] Phone: [phone] [facility]) [facility] (Psychiatry only)  [phone] [address], [place], [place] [address] (Accepts Medicaid and Medicare) [place] (Psychiatry and therapy)  [place], [place], [place] [address] [phone] [facility] Medicare) [place] (psychiatry and therapy) [address], [place], [place] [address] [phone]  [facility] at [address], [place], [place], [place] [address] [phone] Accept [address] [place], [place], [place], [place],  and the following types of Medicaid; Alliance, [place], Partners, [place], [place], [employer], Healthy [place], [place] Complete, and [place], as well as offering a Manufacturing systems engineer and private payment options. Provides In-Office Appointments, Virtual Appointments, and Phone Consultations Offers medication management for ages 84 years old and up, including,  Medication Management for Suboxone and [place] [phone] [address], [place], [place] [address] ([place] Medicaid and Medicare)         19.  [facility] (therapy only)  [address] [place], [place] [address]            [phone] (Accepts medicare)  22. [facility] [address] [place], [place] [address] [phone] (Accepts medicare)  For those who are tech savvy, go on psychology today, type in your local city (i.e. [place]. ) and specify your insurance at the top of the screen after you search. (Medicaid if needed). You can also specify whether you are interested in therapy and psychiatry.  www.psychologytoday.com/us

## 2021-11-07 ENCOUNTER — Encounter (INDEPENDENT_AMBULATORY_CARE_PROVIDER_SITE_OTHER): Payer: Self-pay

## 2021-11-21 ENCOUNTER — Other Ambulatory Visit: Payer: Self-pay | Admitting: Family Medicine

## 2021-12-23 ENCOUNTER — Ambulatory Visit: Payer: 59 | Admitting: Family Medicine

## 2022-05-13 ENCOUNTER — Other Ambulatory Visit: Payer: Self-pay | Admitting: Family Medicine

## 2022-05-15 NOTE — Telephone Encounter (Signed)
Called pt - she will call back to make an appt.

## 2022-05-15 NOTE — Telephone Encounter (Signed)
Courtesy refill. Patient will need an office visit for further refills. Requested Prescriptions  Pending Prescriptions Disp Refills   losartan (COZAAR) 100 MG tablet [Pharmacy Med Name: LOSARTAN POTASSIUM 100 MG TAB] 30 tablet 0    Sig: TAKE 1 TABLET BY MOUTH EVERY DAY     Cardiovascular:  Angiotensin Receptor Blockers Failed - 05/13/2022  3:50 PM      Failed - Cr in normal range and within 180 days    Creatinine, Ser  Date Value Ref Range Status  08/24/2021 0.88 0.44 - 1.00 mg/dL Final         Failed - K in normal range and within 180 days    Potassium  Date Value Ref Range Status  08/24/2021 4.0 3.5 - 5.1 mmol/L Final         Failed - Valid encounter within last 6 months    Recent Outpatient Visits           7 months ago Essential hypertension   [place] [facility], MD   8 months ago Essential hypertension   [place] [facility], MD   8 months ago Chest pain, unspecified type   [place] [facility], MD   9 months ago Annual physical exam   [place] [facility], MD   1 year ago Essential hypertension   [place] [facility], Kandee Keen, MD              Passed - Patient is not pregnant      Passed - Last BP in normal range    BP Readings from Last 1 Encounters:  09/23/21 126/87          amLODipine (NORVASC) 10 MG tablet [Pharmacy Med Name: AMLODIPINE BESYLATE 10 MG TAB] 30 tablet 0    Sig: TAKE 1 TABLET BY MOUTH EVERY DAY     Cardiovascular: Calcium Channel Blockers 2 Failed - 05/13/2022  3:50 PM      Failed - Valid encounter within last 6 months    Recent Outpatient Visits           7 months ago Essential hypertension   [place] [facility], MD   8 months ago Essential hypertension   [place] [facility],  MD   8 months ago Chest pain, unspecified type   [place] [facility], MD   9 months ago Annual physical exam   [place] [facility], MD   1 year ago Essential hypertension    [facility] at [facility], Kandee Keen, MD              Passed - Last BP in normal range    BP Readings from Last 1 Encounters:  09/23/21 126/87         Passed - Last Heart Rate in normal range    Pulse Readings from Last 1 Encounters:  09/23/21 85

## 2022-07-01 ENCOUNTER — Other Ambulatory Visit: Payer: Self-pay | Admitting: Family Medicine

## 2022-07-05 ENCOUNTER — Other Ambulatory Visit: Payer: Self-pay | Admitting: Family Medicine

## 2022-07-05 NOTE — Telephone Encounter (Signed)
Requested Prescriptions  Pending Prescriptions Disp Refills   losartan (COZAAR) 100 MG tablet [Pharmacy Med Name: LOSARTAN POTASSIUM 100 MG TAB] 30 tablet 0    Sig: TAKE 1 TABLET BY MOUTH EVERY DAY     Cardiovascular:  Angiotensin Receptor Blockers Failed - 07/05/2022  2:07 AM      Failed - Cr in normal range and within 180 days    Creatinine, Ser  Date Value Ref Range Status  08/24/2021 0.88 0.44 - 1.00 mg/dL Final         Failed - K in normal range and within 180 days    Potassium  Date Value Ref Range Status  08/24/2021 4.0 3.5 - 5.1 mmol/L Final         Failed - Valid encounter within last 6 months    Recent Outpatient Visits           9 months ago Essential hypertension   [place] [facility], MD   10 months ago Essential hypertension   [place] [facility], MD   10 months ago Chest pain, unspecified type   [place] [facility], MD   10 months ago Annual physical exam   [place] [facility], MD   2 years ago Essential hypertension   [place] [facility], Kandee Keen, MD       Future Appointments             In 3 weeks Georganna Skeans, MD [facility] - Patient is not pregnant      Passed - Last BP in normal range    BP Readings from Last 1 Encounters:  09/23/21 126/87          amLODipine (NORVASC) 10 MG tablet [Pharmacy Med Name: AMLODIPINE BESYLATE 10 MG TAB] 30 tablet 0    Sig: TAKE 1 TABLET BY MOUTH EVERY DAY     Cardiovascular: Calcium Channel Blockers 2 Failed - 07/05/2022  2:07 AM      Failed - Valid encounter within last 6 months    Recent Outpatient Visits           9 months ago Essential hypertension   [place] [facility], MD   10 months ago Essential hypertension   [place]  [facility], MD   10 months ago Chest pain, unspecified type   [place] [facility] at [facility], MD   10 months ago Annual physical exam   [place] [facility], MD   2 years ago Essential hypertension   [place] [facility], Kandee Keen, MD       Future Appointments             In 3 weeks Georganna Skeans, MD [facility] - Last BP in normal range    BP Readings from Last 1 Encounters:  09/23/21 126/87         Passed - Last Heart Rate in normal range    Pulse Readings from Last 1 Encounters:  09/23/21 85

## 2022-07-19 ENCOUNTER — Other Ambulatory Visit: Payer: Self-pay | Admitting: Family Medicine

## 2022-07-26 ENCOUNTER — Encounter: Payer: Self-pay | Admitting: Family Medicine

## 2022-07-26 ENCOUNTER — Ambulatory Visit: Payer: 59 | Admitting: Family Medicine

## 2022-07-26 ENCOUNTER — Ambulatory Visit (INDEPENDENT_AMBULATORY_CARE_PROVIDER_SITE_OTHER): Payer: 59 | Admitting: Family Medicine

## 2022-07-26 VITALS — BP 118/82 | HR 101 | Temp 98.1°F | Resp 16 | Wt 189.8 lb

## 2022-07-26 DIAGNOSIS — R600 Localized edema: Secondary | ICD-10-CM | POA: Diagnosis not present

## 2022-07-26 DIAGNOSIS — F17200 Nicotine dependence, unspecified, uncomplicated: Secondary | ICD-10-CM

## 2022-07-26 DIAGNOSIS — F419 Anxiety disorder, unspecified: Secondary | ICD-10-CM

## 2022-07-26 DIAGNOSIS — F32A Depression, unspecified: Secondary | ICD-10-CM

## 2022-07-26 DIAGNOSIS — I10 Essential (primary) hypertension: Secondary | ICD-10-CM

## 2022-07-26 NOTE — Progress Notes (Signed)
Patient said her feet has started to swelling for about 3 months.. Patient said her feet swellin her sleep Patient is here for their 3 month follow-up  Care gaps have been discussed with patient

## 2022-07-31 ENCOUNTER — Encounter: Payer: Self-pay | Admitting: Family Medicine
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
## 2022-08-31 ENCOUNTER — Other Ambulatory Visit: Payer: Self-pay | Admitting: Family Medicine

## 2022-08-31 NOTE — Telephone Encounter (Signed)
Requested Prescriptions  Pending Prescriptions Disp Refills   amLODipine (NORVASC) 10 MG tablet [Pharmacy Med Name: AMLODIPINE BESYLATE 10 MG TAB] 90 tablet 1    Sig: TAKE 1 TABLET BY MOUTH EVERY DAY     Cardiovascular: Calcium Channel Blockers 2 Passed - 08/31/2022  8:32 AM      Passed - Last BP in normal range    BP Readings from Last 1 Encounters:  07/26/22 118/82         Passed - Last Heart Rate in normal range    Pulse Readings from Last 1 Encounters:  07/26/22 (!) 101         Passed - Valid encounter within last 6 months    Recent Outpatient Visits           1 month ago Essential hypertension   [place] [facility], MD   11 months ago Essential hypertension   [place] [facility], MD   11 months ago Essential hypertension   [place] [facility], MD   1 year ago Chest pain, unspecified type   [place] [facility], MD   1 year ago Annual physical exam   [place] [facility], MD

## 2022-09-04 ENCOUNTER — Other Ambulatory Visit: Payer: Self-pay

## 2022-09-04 ENCOUNTER — Other Ambulatory Visit: Payer: Self-pay | Admitting: Family Medicine

## 2022-09-04 DIAGNOSIS — I10 Essential (primary) hypertension: Secondary | ICD-10-CM

## 2022-09-04 MED ORDER — AMLODIPINE BESYLATE 10 MG PO TABS: 10.0000 mg | ORAL_TABLET | Freq: Every day | ORAL | 1 refills | Status: AC

## 2022-09-14 ENCOUNTER — Other Ambulatory Visit: Payer: Self-pay | Admitting: *Deleted

## 2022-09-14 ENCOUNTER — Telehealth: Payer: Self-pay | Admitting: Family Medicine

## 2022-09-14 MED ORDER — LOSARTAN POTASSIUM 100 MG PO TABS: 100.0000 mg | ORAL_TABLET | Freq: Every day | ORAL | 0 refills | Status: DC

## 2022-09-14 NOTE — Telephone Encounter (Signed)
Patient called and refill sent . Patient to schedule appt bat GYn for other issue

## 2022-09-14 NOTE — Telephone Encounter (Signed)
Copied from CRM [id number]. Topic: General - Other >> Sep 14, 2022  8:06 AM Phill Myron wrote: Ms Godette needs her losartan (COZAAR) 100 MG tablet changed 90 day per [facility] and Ms Hanni would like to change her Nexplanon out...please advise

## 2022-10-07 ENCOUNTER — Other Ambulatory Visit: Payer: Self-pay | Admitting: Family Medicine
